# Patient Record
Sex: Female | Born: 1990 | Race: Black or African American | Hispanic: No | Marital: Married
Health system: Southern US, Community
[De-identification: ages and names within clinical notes are randomized; demographics above are authoritative.]

## PROBLEM LIST (undated history)

## (undated) DIAGNOSIS — D649 Anemia, unspecified: Secondary | ICD-10-CM

## (undated) DIAGNOSIS — Z9889 Other specified postprocedural states: Secondary | ICD-10-CM

## (undated) HISTORY — PX: NO PAST SURGERIES: SHX2092

## (undated) HISTORY — DX: Anemia, unspecified: D64.9

---

## 2017-11-01 ENCOUNTER — Ambulatory Visit (HOSPITAL_COMMUNITY)
Admission: EM | Admit: 2017-11-01 | Discharge: 2017-11-01 | Disposition: A | Discharge status: Home / Self Care | Payer: Medicaid Other | Attending: Internal Medicine | Admitting: Internal Medicine

## 2017-11-01 ENCOUNTER — Ambulatory Visit (INDEPENDENT_AMBULATORY_CARE_PROVIDER_SITE_OTHER): Payer: Medicaid Other

## 2017-11-01 ENCOUNTER — Encounter (HOSPITAL_COMMUNITY): Payer: Self-pay

## 2017-11-01 DIAGNOSIS — R079 Chest pain, unspecified: Secondary | ICD-10-CM | POA: Diagnosis not present

## 2017-11-01 DIAGNOSIS — R630 Anorexia: Secondary | ICD-10-CM

## 2017-11-01 DIAGNOSIS — R0789 Other chest pain: Secondary | ICD-10-CM | POA: Diagnosis not present

## 2017-11-01 DIAGNOSIS — Z131 Encounter for screening for diabetes mellitus: Secondary | ICD-10-CM | POA: Diagnosis not present

## 2017-11-01 LAB — GLUCOSE, CAPILLARY: Glucose-Capillary: 103 mg/dL — ABNORMAL HIGH (ref 70–99)

## 2017-11-01 MED ORDER — ACETAMINOPHEN 500 MG PO TABS: 500.0000 mg | ORAL_TABLET | Freq: Four times a day (QID) | ORAL | 0 refills | Status: DC | PRN

## 2017-11-01 MED ORDER — OMEPRAZOLE 20 MG PO CPDR: 20.0000 mg | DELAYED_RELEASE_CAPSULE | Freq: Every day | ORAL | 0 refills | Status: DC

## 2017-11-01 MED FILL — OMEPRAZOLE 20 MG CAP: 20 | 30 days supply | Qty: 30 | Fill #0

## 2017-11-01 NOTE — Discharge Instructions (Addendum)
Your chest xray, blood sugar and ekg look well today.  We will start with treatment with tylenol for pain, may take every 6 hours as needed. Please also take prilosec daily as reflux may be contributing to your symptoms.  Please follow up with a primary care provider for recheck as you may need further evaluation if symptoms persist.  If develop worsening of pain, shortness of breath , dizziness, vomiting or otherwise worsening please go to the Er.

## 2017-11-01 NOTE — ED Provider Notes (Signed)
[facility]    CSN: [id number] Arrival date & time: 11/01/17  0900     History   Chief Complaint Chief Complaint  Patient presents with  . Chest Pain  . Back Pain    HPI Robin Cordova is a 27 y.o. female.   Robin Cordova presents with complaints of chest pain which radiates to the back which started 3 days ago. Comes and goes. Currently its improved. Decreased appetite. Has been drinking a lot of fluids but unable to eat for the past two days. Normal urination. No nausea, vomiting or diarrhea. No fever. No shortness of breath or cough. No leg pain or swelling. She feels like the bottom of her feet have a burning sensation. No medical history and no history of any similar episodes. Travelled here from [place] three weeks ago. She does not smoke. No hormone therapy.    Swahili audio interpreter used to collect history and physical exam.    ROS per HPI.      History reviewed. No pertinent past medical history.  There are no active problems to display for this patient.   History reviewed. No pertinent surgical history.  OB History   None      Home Medications    Prior to Admission medications   Medication Sig Start Date End Date Taking? Authorizing Provider  acetaminophen (TYLENOL) 500 MG tablet Take 1 tablet (500 mg total) by mouth every 6 (six) hours as needed. 11/01/17   Georgetta Haber, NP  omeprazole (PRILOSEC) 20 MG capsule Take 1 capsule (20 mg total) by mouth daily. 11/01/17   Georgetta Haber, NP    Family History History reviewed. No pertinent family history.  Social History Social History   Tobacco Use  . Smoking status: Not on file  Substance Use Topics  . Alcohol use: Not on file  . Drug use: Not on file     Allergies   Patient has no allergy information on record.   Review of Systems Review of Systems   Physical Exam Triage Vital Signs ED Triage Vitals [11/01/17 0931]  Enc Vitals Group     BP 113/70     Pulse Rate 65     Resp  20     Temp 98.7 F (37.1 C)     Temp Source Oral     SpO2 100 %     Weight      Height      Head Circumference      Peak Flow      Pain Score      Pain Loc      Pain Edu?      Excl. in GC?    No data found.  Updated Vital Signs BP 113/70 (BP Location: Left Arm)   Pulse 65   Temp 98.7 F (37.1 C) (Oral)   Resp 20   LMP 10/19/2017   SpO2 100%    Physical Exam  Constitutional: She is oriented to person, place, and time. She appears well-developed and well-nourished. No distress.  HENT:  Head: Normocephalic and atraumatic.  Eyes: Pupils are equal, round, and reactive to light. EOM are normal.  Neck: Normal range of motion.  Cardiovascular: Normal rate and regular rhythm.  Murmur heard. Pulmonary/Chest: Effort normal and breath sounds normal.  Musculoskeletal:       Right lower leg: Normal.       Left lower leg: Normal.       Right foot: Normal.  Left foot: Normal.  Neurological: She is alert and oriented to person, place, and time.  Skin: Skin is warm and dry.   EKG NSR rate 85 without acute changes noted  UC Treatments / Results  Labs (all labs ordered are listed, but only abnormal results are displayed) Labs Reviewed  GLUCOSE, CAPILLARY - Abnormal; Notable for the following components:      Result Value   Glucose-Capillary 103 (*)    All other components within normal limits    EKG None  Radiology Dg Chest 2 View  Result Date: 11/01/2017 CLINICAL DATA:  27 year old female with mid and upper chest pain for 3 days. Polydipsia. EXAM: CHEST - 2 VIEW COMPARISON:  None. FINDINGS: Normal lung volumes and mediastinal contours. Visualized tracheal air column is within normal limits. Both lungs are clear. No pneumothorax or pleural effusion. Negative visible osseous structures and bowel gas pattern. IMPRESSION: Negative. Electronically Signed   By: Odessa FlemingH  Hall M.D.   On: 11/01/2017 10:23    Procedures Procedures (including critical care time)  Medications  Ordered in UC Medications - No data to display  Initial Impression / Assessment and Plan / UC Course  I have reviewed the triage vital signs and the nursing notes.  Pertinent labs & imaging results that were available during my care of the patient were reviewed by me and considered in my medical decision making (see chart for details).     Non toxic. Afebrile. Without tachycardia. Ekg, chest xray reassuring. Recent travel 3 weeks ago only risk factor for PE. No other cardiac risk factors, low suspicion for ACS at this time. Pain improved while in clinic today. Decreased appetite, question if gerd type symptoms are contributing, daily omeprazole initiated. Tylenol prn for pain. Encouraged establish with PCP for recheck as may need further evaluation if symptoms persist. Strict return precautions provided. Patient verbalized understanding and agreeable to plan.   Final Clinical Impressions(s) / UC Diagnoses   Final diagnoses:  Chest pain, unspecified type  Decreased appetite     Discharge Instructions     Your chest xray, blood sugar and ekg look well today.  We will start with treatment with tylenol for pain, may take every 6 hours as needed. Please also take prilosec daily as reflux may be contributing to your symptoms.  Please follow up with a primary care provider for recheck as you may need further evaluation if symptoms persist.  If develop worsening of pain, shortness of breath , dizziness, vomiting or otherwise worsening please go to the Er.     ED Prescriptions    Medication Sig Dispense Auth. Provider   acetaminophen (TYLENOL) 500 MG tablet Take 1 tablet (500 mg total) by mouth every 6 (six) hours as needed. 30 tablet Linus MakoBurky, Natalie B, NP   omeprazole (PRILOSEC) 20 MG capsule Take 1 capsule (20 mg total) by mouth daily. 30 capsule Georgetta HaberBurky, Natalie B, NP     Controlled Substance Prescriptions [place] Controlled Substance Registry consulted? Not Applicable   Georgetta HaberBurky, Natalie B,  NP 11/01/17 1042

## 2017-11-01 NOTE — ED Triage Notes (Signed)
Pt presents with chest and back pain and can only tell me chest is hurting , cant tell me of any other symptoms .

## 2018-01-31 ENCOUNTER — Ambulatory Visit (INDEPENDENT_AMBULATORY_CARE_PROVIDER_SITE_OTHER): Payer: Medicaid Other | Admitting: Family Medicine

## 2018-01-31 ENCOUNTER — Encounter: Payer: Self-pay | Admitting: Family Medicine

## 2018-01-31 ENCOUNTER — Ambulatory Visit: Payer: Medicaid Other | Admitting: Family Medicine

## 2018-01-31 VITALS — BP 114/69 | HR 52 | Resp 17 | Ht 62.0 in | Wt 125.6 lb

## 2018-01-31 DIAGNOSIS — Z130 Encounter for screening for diseases of the blood and blood-forming organs and certain disorders involving the immune mechanism: Secondary | ICD-10-CM | POA: Diagnosis not present

## 2018-01-31 DIAGNOSIS — Z1329 Encounter for screening for other suspected endocrine disorder: Secondary | ICD-10-CM

## 2018-01-31 DIAGNOSIS — Z131 Encounter for screening for diabetes mellitus: Secondary | ICD-10-CM

## 2018-01-31 DIAGNOSIS — Z23 Encounter for immunization: Secondary | ICD-10-CM

## 2018-01-31 DIAGNOSIS — Z0000 Encounter for general adult medical examination without abnormal findings: Secondary | ICD-10-CM

## 2018-01-31 DIAGNOSIS — Z7689 Persons encountering health services in other specified circumstances: Secondary | ICD-10-CM

## 2018-01-31 NOTE — Patient Instructions (Addendum)
Thank you for choosing Primary Care at [facility] to be your medical home!    Robin Cordova was seen by Molli Barrows, FNP today.   Robin Cordova's primary care provider is Scot Jun, FNP.   For the best care possible, you should try to see Molli Barrows, FNP-C whenever you come to the clinic.   We look forward to seeing you again soon!  If you have any questions about your visit today, please call us at [phone] or feel free to reach your primary care provider via [place].    Health Maintenance, Female Adopting a healthy lifestyle and getting preventive care can go a long way to promote health and wellness. Talk with your health care provider about what schedule of regular examinations is right for you. This is a good chance for you to check in with your provider about disease prevention and staying healthy. In between checkups, there are plenty of things you can do on your own. Experts have done a lot of research about which lifestyle changes and preventive measures are most likely to keep you healthy. Ask your health care provider for more information. Weight and diet Eat a healthy diet  Be sure to include plenty of vegetables, fruits, low-fat dairy products, and lean protein.  Do not eat a lot of foods high in solid fats, added sugars, or salt.  Get regular exercise. This is one of the most important things you can do for your health. ? Most adults should exercise for at least 150 minutes each week. The exercise should increase your heart rate and make you sweat (moderate-intensity exercise). ? Most adults should also do strengthening exercises at least twice a week. This is in addition to the moderate-intensity exercise.  Maintain a healthy weight  Body mass index (BMI) is a measurement that can be used to identify possible weight problems. It estimates body fat based on height and weight. Your health care provider can help determine your BMI and help you achieve or  maintain a healthy weight.  For females 62 years of age and older: ? A BMI below 18.5 is considered underweight. ? A BMI of 18.5 to 24.9 is normal. ? A BMI of 25 to 29.9 is considered overweight. ? A BMI of 30 and above is considered obese.  Watch levels of cholesterol and blood lipids  You should start having your blood tested for lipids and cholesterol at 26 years of age, then have this test every 5 years.  You may need to have your cholesterol levels checked more often if: ? Your lipid or cholesterol levels are high. ? You are older than 25 years of age. ? You are at high risk for heart disease.  Cancer screening Lung Cancer  Lung cancer screening is recommended for adults 38-74 years old who are at high risk for lung cancer because of a history of smoking.  A yearly low-dose CT scan of the lungs is recommended for people who: ? Currently smoke. ? Have quit within the past 15 years. ? Have at least a 30-pack-year history of smoking. A pack year is smoking an average of one pack of cigarettes a day for 1 year.  Yearly screening should continue until it has been 15 years since you quit.  Yearly screening should stop if you develop a health problem that would prevent you from having lung cancer treatment.  Breast Cancer  Practice breast self-awareness. This means understanding how your breasts normally appear and feel.  It  also means doing regular breast self-exams. Let your health care provider know about any changes, no matter how small.  If you are in your 20s or 30s, you should have a clinical breast exam (CBE) by a health care provider every 1-3 years as part of a regular health exam.  If you are 68 or older, have a CBE every year. Also consider having a breast X-ray (mammogram) every year.  If you have a family history of breast cancer, talk to your health care provider about genetic screening.  If you are at high risk for breast cancer, talk to your health care  provider about having an MRI and a mammogram every year.  Breast cancer gene (BRCA) assessment is recommended for women who have family members with BRCA-related cancers. BRCA-related cancers include: ? Breast. ? Ovarian. ? Tubal. ? Peritoneal cancers.  Results of the assessment will determine the need for genetic counseling and BRCA1 and BRCA2 testing.  Cervical Cancer Your health care provider may recommend that you be screened regularly for cancer of the pelvic organs (ovaries, uterus, and vagina). This screening involves a pelvic examination, including checking for microscopic changes to the surface of your cervix (Pap test). You may be encouraged to have this screening done every 3 years, beginning at age 36.  For women ages 67-65, health care providers may recommend pelvic exams and Pap testing every 3 years, or they may recommend the Pap and pelvic exam, combined with testing for human papilloma virus (HPV), every 5 years. Some types of HPV increase your risk of cervical cancer. Testing for HPV may also be done on women of any age with unclear Pap test results.  Other health care providers may not recommend any screening for nonpregnant women who are considered low risk for pelvic cancer and who do not have symptoms. Ask your health care provider if a screening pelvic exam is right for you.  If you have had past treatment for cervical cancer or a condition that could lead to cancer, you need Pap tests and screening for cancer for at least 20 years after your treatment. If Pap tests have been discontinued, your risk factors (such as having a new sexual partner) need to be reassessed to determine if screening should resume. Some women have medical problems that increase the chance of getting cervical cancer. In these cases, your health care provider may recommend more frequent screening and Pap tests.  Colorectal Cancer  This type of cancer can be detected and often prevented.  Routine  colorectal cancer screening usually begins at 26 years of age and continues through 26 years of age.  Your health care provider may recommend screening at an earlier age if you have risk factors for colon cancer.  Your health care provider may also recommend using home test kits to check for hidden blood in the stool.  A small camera at the end of a tube can be used to examine your colon directly (sigmoidoscopy or colonoscopy). This is done to check for the earliest forms of colorectal cancer.  Routine screening usually begins at age 74.  Direct examination of the colon should be repeated every 5-10 years through 26 years of age. However, you may need to be screened more often if early forms of precancerous polyps or small growths are found.  Skin Cancer  Check your skin from head to toe regularly.  Tell your health care provider about any new moles or changes in moles, especially if there is a change  in a mole's shape or color.  Also tell your health care provider if you have a mole that is larger than the size of a pencil eraser.  Always use sunscreen. Apply sunscreen liberally and repeatedly throughout the day.  Protect yourself by wearing long sleeves, pants, a wide-brimmed hat, and sunglasses whenever you are outside.  Heart disease, diabetes, and high blood pressure  High blood pressure causes heart disease and increases the risk of stroke. High blood pressure is more likely to develop in: ? People who have blood pressure in the high end of the normal range (130-139/85-89 mm Hg). ? People who are overweight or obese. ? People who are African American.  If you are 76-32 years of age, have your blood pressure checked every 3-5 years. If you are 59 years of age or older, have your blood pressure checked every year. You should have your blood pressure measured twice-once when you are at a hospital or clinic, and once when you are not at a hospital or clinic. Record the average of the  two measurements. To check your blood pressure when you are not at a hospital or clinic, you can use: ? An automated blood pressure machine at a pharmacy. ? A home blood pressure monitor.  If you are between 24 years and 62 years old, ask your health care provider if you should take aspirin to prevent strokes.  Have regular diabetes screenings. This involves taking a blood sample to check your fasting blood sugar level. ? If you are at a normal weight and have a low risk for diabetes, have this test once every three years after 26 years of age. ? If you are overweight and have a high risk for diabetes, consider being tested at a younger age or more often. Preventing infection Hepatitis B  If you have a higher risk for hepatitis B, you should be screened for this virus. You are considered at high risk for hepatitis B if: ? You were born in a country where hepatitis B is common. Ask your health care provider which countries are considered high risk. ? Your parents were born in a high-risk country, and you have not been immunized against hepatitis B (hepatitis B vaccine). ? You have HIV or AIDS. ? You use needles to inject street drugs. ? You live with someone who has hepatitis B. ? You have had sex with someone who has hepatitis B. ? You get hemodialysis treatment. ? You take certain medicines for conditions, including cancer, organ transplantation, and autoimmune conditions.  Hepatitis C  Blood testing is recommended for: ? Everyone born from [birth year] through 1964. ? Anyone with known risk factors for hepatitis C.  Sexually transmitted infections (STIs)  You should be screened for sexually transmitted infections (STIs) including gonorrhea and chlamydia if: ? You are sexually active and are younger than 26 years of age. ? You are older than 26 years of age and your health care provider tells you that you are at risk for this type of infection. ? Your sexual activity has changed since you  were last screened and you are at an increased risk for chlamydia or gonorrhea. Ask your health care provider if you are at risk.  If you do not have HIV, but are at risk, it may be recommended that you take a prescription medicine daily to prevent HIV infection. This is called pre-exposure prophylaxis (PrEP). You are considered at risk if: ? You are sexually active and do not regularly use  condoms or know the HIV status of your partner(s). ? You take drugs by injection. ? You are sexually active with a partner who has HIV.  Talk with your health care provider about whether you are at high risk of being infected with HIV. If you choose to begin PrEP, you should first be tested for HIV. You should then be tested every 3 months for as long as you are taking PrEP. Pregnancy  If you are premenopausal and you may become pregnant, ask your health care provider about preconception counseling.  If you may become pregnant, take 400 to 800 micrograms (mcg) of folic acid every day.  If you want to prevent pregnancy, talk to your health care provider about birth control (contraception). Osteoporosis and menopause  Osteoporosis is a disease in which the bones lose minerals and strength with aging. This can result in serious bone fractures. Your risk for osteoporosis can be identified using a bone density scan.  If you are 54 years of age or older, or if you are at risk for osteoporosis and fractures, ask your health care provider if you should be screened.  Ask your health care provider whether you should take a calcium or vitamin D supplement to lower your risk for osteoporosis.  Menopause may have certain physical symptoms and risks.  Hormone replacement therapy may reduce some of these symptoms and risks. Talk to your health care provider about whether hormone replacement therapy is right for you. Follow these instructions at home:  Schedule regular health, dental, and eye exams.  Stay current  with your immunizations.  Do not use any tobacco products including cigarettes, chewing tobacco, or electronic cigarettes.  If you are pregnant, do not drink alcohol.  If you are breastfeeding, limit how much and how often you drink alcohol.  Limit alcohol intake to no more than 1 drink per day for nonpregnant women. One drink equals 12 ounces of beer, 5 ounces of wine, or 1 ounces of hard liquor.  Do not use street drugs.  Do not share needles.  Ask your health care provider for help if you need support or information about quitting drugs.  Tell your health care provider if you often feel depressed.  Tell your health care provider if you have ever been abused or do not feel safe at home. This information is not intended to replace advice given to you by your health care provider. Make sure you discuss any questions you have with your health care provider. Document Released: 10/02/2010 Document Revised: 08/25/2015 Document Reviewed: 12/21/2014 Elsevier Interactive Patient Education  [employer].

## 2018-01-31 NOTE — Progress Notes (Signed)
Patient ID: Robin Cordova, female   DOB: 1990/05/17, 27 y.o.   MRN: [id number]    PCP: Bing Neighbors, FNP  Chief Complaint  Patient presents with  . Establish Care    Subjective:  HPI Robin Cordova is a 27 y.o. female presents for evaluation routine annual exam. Medical history is only significant for routine vaginal deliveries x 2 weeks.  Reports a routine [facility].  Social History   Socioeconomic History  . Marital status: Married    Spouse name: Not on file  . Number of children: Not on file  . Years of education: Not on file  . Highest education level: Not on file  Occupational History  . Not on file  Social Needs  . Financial resource strain: Not on file  . Food insecurity:    Worry: Not on file    Inability: Not on file  . Transportation needs:    Medical: Not on file    Non-medical: Not on file  Tobacco Use  . Smoking status: Never Smoker  . Smokeless tobacco: Never Used  Substance and Sexual Activity  . Alcohol use: Never    Frequency: Never  . Drug use: Never  . Sexual activity: Not on file  Lifestyle  . Physical activity:    Days per week: Not on file    Minutes per session: Not on file  . Stress: Not on file  Relationships  . Social connections:    Talks on phone: Not on file    Gets together: Not on file    Attends religious service: Not on file    Active member of club or organization: Not on file    Attends meetings of clubs or organizations: Not on file    Relationship status: Not on file  . Intimate partner violence:    Fear of current or ex partner: Not on file    Emotionally abused: Not on file    Physically abused: Not on file    Forced sexual activity: Not on file  Other Topics Concern  . Not on file  Social History Narrative  . Not on file    Family History  Problem Relation Age of Onset  . Healthy Mother   . Healthy Father   . Healthy Daughter   . Healthy Son   . Cancer Neg Hx   .  Stroke Neg Hx     Review of Systems  All other systems reviewed and are negative.  No Known Allergies  Prior to Admission medications   Not on File    Past Medical, Surgical Family and Social History reviewed and updated.    Objective:   Today's Vitals   01/31/18 1102  BP: 114/69  Pulse: (!) 52  Resp: 17  SpO2: 99%  Weight: 125 lb 9.6 oz (57 kg)  Height: 5\' 2"  (1.575 m)    Wt Readings from Last 3 Encounters:  01/31/18 125 lb 9.6 oz (57 kg)   Physical Exam BP 114/69   Pulse (!) 52   Resp 17   Ht 5\' 2"  (1.575 m)   Wt 125 lb 9.6 oz (57 kg)   SpO2 99%   BMI 22.97 kg/m   General Appearance:    Alert, cooperative, no distress, appears stated age  Head:    Normocephalic, without obvious abnormality, atraumatic  Eyes:    PERRL, conjunctiva/corneas clear, EOM's intact, fundi    benign, both eyes  Ears:  Normal TM's and external ear canals, both ears  Nose:   Nares normal, septum midline, mucosa normal, no drainage    or sinus tenderness  Throat:   Lips, mucosa, and tongue normal; teeth and gums normal  Neck:   Supple, symmetrical, trachea midline, no adenopathy;    thyroid:  no enlargement/tenderness/nodules; no carotid   bruit or JVD  Back:     Symmetric, no curvature, ROM normal, no CVA tenderness  Lungs:     Clear to auscultation bilaterally, respirations unlabored  Chest Wall:    No tenderness or deformity   Heart:    Regular rate and rhythm, S1 and S2 normal, no murmur, rub   or gallop. Radial pulses +2, equal bilaterally.  Breast Exam:    No tenderness, masses, or nipple abnormality  Abdomen:     Soft, non-tender, bowel sounds active all four quadrants,    no masses, no organomegaly  Extremities:   Extremities normal, atraumatic, no cyanosis or edema  Skin:   Skin color, texture, turgor normal, no rashes or lesions  Neurologic:   Normal strength, sensation and reflexes    throughout     Assessment & Plan:  1. Encounter to establish care 2. Blood tests  for routine general physical examination 3. Routine general medical examination at a health care facility - Flu Vaccine QUAD 36+ mos IM 4. Screening for deficiency anemia - CBC with Differential; Future 5. Screening for thyroid disorder - Thyroid Panel With TSH 6. Screening for diabetes mellitus - Comprehensive metabolic panel; Future - Hemoglobin A1c - Comprehensive metabolic panel   Orders Placed This Encounter  Procedures  . Flu Vaccine QUAD 36+ mos IM  . CBC with Differential  . Comprehensive metabolic panel  . Thyroid Panel With TSH  . Hemoglobin A1c    If symptoms worsen or do not improve, return for follow-up, follow-up with PCP, or at the emergency department if severity of symptoms warrant a higher level of care.     Joaquin Courts, FNP [facility] at [facility] [address], [place]: [address]  [phone]

## 2018-02-01 LAB — HEMOGLOBIN A1C
Est. average glucose Bld gHb Est-mCnc: 105 mg/dL
HEMOGLOBIN A1C: 5.3 % (ref 4.8–5.6)

## 2018-02-02 LAB — CBC WITH DIFFERENTIAL/PLATELET
BASOS ABS: 0 10*3/uL (ref 0.0–0.2)
BASOS: 1 %
EOS (ABSOLUTE): 0.2 10*3/uL (ref 0.0–0.4)
Eos: 4 %
HEMOGLOBIN: 13.3 g/dL (ref 11.1–15.9)
Hematocrit: 39.7 % (ref 34.0–46.6)
IMMATURE GRANS (ABS): 0 10*3/uL (ref 0.0–0.1)
IMMATURE GRANULOCYTES: 0 %
Lymphocytes Absolute: 2.1 10*3/uL (ref 0.7–3.1)
Lymphs: 42 %
MCH: 29.2 pg (ref 26.6–33.0)
MCHC: 33.5 g/dL (ref 31.5–35.7)
MCV: 87 fL (ref 79–97)
MONOCYTES: 11 %
Monocytes Absolute: 0.6 10*3/uL (ref 0.1–0.9)
NEUTROS PCT: 42 %
Neutrophils Absolute: 2.1 10*3/uL (ref 1.4–7.0)
Platelets: 329 10*3/uL (ref 150–450)
RBC: 4.56 x10E6/uL (ref 3.77–5.28)
RDW: 13.6 % (ref 12.3–15.4)
WBC: 5 10*3/uL (ref 3.4–10.8)

## 2018-02-02 LAB — COMPREHENSIVE METABOLIC PANEL
ALT: 13 IU/L (ref 0–32)
AST: 22 IU/L (ref 0–40)
Albumin/Globulin Ratio: 1.4 (ref 1.2–2.2)
Albumin: 4.3 g/dL (ref 3.5–5.5)
Alkaline Phosphatase: 68 IU/L (ref 39–117)
BUN/Creatinine Ratio: 6 — ABNORMAL LOW (ref 9–23)
BUN: 4 mg/dL — AB (ref 6–20)
Bilirubin Total: 0.5 mg/dL (ref 0.0–1.2)
CALCIUM: 9.2 mg/dL (ref 8.7–10.2)
CO2: 17 mmol/L — AB (ref 20–29)
Chloride: 106 mmol/L (ref 96–106)
Creatinine, Ser: 0.65 mg/dL (ref 0.57–1.00)
GFR, EST AFRICAN AMERICAN: 142 mL/min/{1.73_m2} (ref >59)
GFR, EST NON AFRICAN AMERICAN: 123 mL/min/{1.73_m2} (ref >59)
GLUCOSE: 84 mg/dL (ref 65–99)
Globulin, Total: 3.1 g/dL (ref 1.5–4.5)
Potassium: 4.6 mmol/L (ref 3.5–5.2)
Sodium: 142 mmol/L (ref 134–144)
TOTAL PROTEIN: 7.4 g/dL (ref 6.0–8.5)

## 2018-02-02 LAB — THYROID PANEL WITH TSH
Free Thyroxine Index: 1.8 (ref 1.2–4.9)
T3 Uptake Ratio: 23 % — ABNORMAL LOW (ref 24–39)
T4, Total: 7.9 ug/dL (ref 4.5–12.0)
TSH: 1.41 u[IU]/mL (ref 0.450–4.500)

## 2018-02-14 NOTE — Progress Notes (Signed)
Normal lab letter mailed.

## 2019-03-30 ENCOUNTER — Ambulatory Visit: Payer: Medicaid Other | Attending: Internal Medicine

## 2019-03-30 DIAGNOSIS — Z20822 Contact with and (suspected) exposure to covid-19: Secondary | ICD-10-CM

## 2019-04-01 LAB — NOVEL CORONAVIRUS, NAA: SARS-CoV-2, NAA: NOT DETECTED

## 2019-04-03 NOTE — L&D Delivery Note (Addendum)
OB/GYN Faculty Practice Delivery Note  Robin Cordova is a 29 y.o. [id number] s/p SVD at [redacted]w[redacted]d. She was admitted for labor management.   ROM: 0h 11m with clear fluid GBS Status: Negative/-- (09/03 1410) Maximum Maternal Temperature: 98.7 F  Labor Progress: Patient presented to L&D for labor management. Initial SVE: 10/100/+2. Labor course was uncomplicated. AROM @ 1735 with clear fluid.   Delivery Date/Time: 12/23/19 @ 1753 Delivery: Called to room and patient was complete. Infant head delivered with maternal pushing, LOA position. Shoulder dystocia identified and was fast resolved with McRoberts, suprapubic pressure, and delivery of posterior arm. No nuchal cord present. Infant with spontaneous cry, placed on mother's abdomen, dried and stimulated. Cord clamped x 2 after 1-minute delay, and cut by Dr. Dorice Lamas. Cord blood drawn. Placenta delivered spontaneously with gentle cord traction. Trailing placental membranes removed from vault. Fundus firm with massage and pitocin started. Labia, perineum, vagina, and cervix inspected and significant for 2nd degree perineal lac.   Baby Weight: per chart  Cord: central insertion, 3 vessel Placenta: Sent to L&D, intact Complications: SD Lacerations: 2nd degree perineal, and was repaired in the standard fashion with 2-0 Vicryl rapide EBL: 400 cc Analgesia: local, Fentanyl   Infant: APGAR (1 MIN): 9   APGAR (5 MINS): 9   Herby Abraham MD, PGY-1 OBGYN Faculty Teaching Service  12/23/2019, 9:30 PM   Midwife attestation: I was gloved and present for delivery in its entirety and I agree with the above resident's note.  Donette Larry, CNM 8:32 AM

## 2019-08-03 ENCOUNTER — Other Ambulatory Visit (HOSPITAL_COMMUNITY)
Admission: RE | Admit: 2019-08-03 | Discharge: 2019-08-03 | Disposition: A | Discharge status: Home / Self Care | Payer: Medicaid Other | Source: Ambulatory Visit | Attending: Family Medicine | Admitting: Family Medicine

## 2019-08-03 ENCOUNTER — Other Ambulatory Visit: Payer: Self-pay

## 2019-08-03 ENCOUNTER — Ambulatory Visit (INDEPENDENT_AMBULATORY_CARE_PROVIDER_SITE_OTHER): Payer: Medicaid Other | Admitting: Family Medicine

## 2019-08-03 VITALS — BP 102/60 | HR 83 | Temp 98.9°F | Wt 136.2 lb

## 2019-08-03 DIAGNOSIS — Z3492 Encounter for supervision of normal pregnancy, unspecified, second trimester: Secondary | ICD-10-CM | POA: Diagnosis not present

## 2019-08-03 DIAGNOSIS — Z124 Encounter for screening for malignant neoplasm of cervix: Secondary | ICD-10-CM | POA: Insufficient documentation

## 2019-08-03 DIAGNOSIS — Z113 Encounter for screening for infections with a predominantly sexual mode of transmission: Secondary | ICD-10-CM

## 2019-08-03 DIAGNOSIS — Z3490 Encounter for supervision of normal pregnancy, unspecified, unspecified trimester: Secondary | ICD-10-CM | POA: Insufficient documentation

## 2019-08-03 LAB — POCT URINALYSIS DIP (MANUAL ENTRY)
Bilirubin, UA: NEGATIVE
Blood, UA: NEGATIVE
Glucose, UA: NEGATIVE mg/dL
Ketones, POC UA: NEGATIVE mg/dL
Leukocytes, UA: NEGATIVE
Nitrite, UA: NEGATIVE
Protein Ur, POC: NEGATIVE mg/dL
Spec Grav, UA: 1.02 (ref 1.010–1.025)
Urobilinogen, UA: 1 E.U./dL
pH, UA: 7.5 (ref 5.0–8.0)

## 2019-08-03 MED ORDER — PRENATAL MULTIVITAMIN CH: 1.0000 | ORAL_TABLET | Freq: Every day | ORAL | 1 refills | Status: DC

## 2019-08-03 NOTE — Progress Notes (Signed)
Patient Name: Robin Cordova Date of Birth: [birth year] [facility] Initial Prenatal Visit  Robin Cordova is a 29 y.o. year old No obstetric history on file. at Unknown who presents for her initial prenatal visit. Pregnancy is planned She reports nausea. She is not taking a prenatal vitamin.  She denies pelvic pain or vaginal bleeding.   Chart review shows that she did have 1 incidence of vaginal bleeding about 1 month ago and was assessed in the emergency room and [place].  She has had no further incidents of vaginal bleeding since that time.  Pregnancy Dating: . The patient is dated by unsure.  Marland Kitchen LMP: 03/16/2020 not confident that her LMP is correct . Period is certain:  No.  . Periods were regular:  No.  . LMP was a typical period:  No.  . Using hormonal contraception in 3 months prior to conception: No  Lab Review: . No blood work or [name] yet done for this pregnancy. No lab work on file.  PMH: Reviewed and as detailed below: . HTN: No  . Type 1 or 2 Diabetes: No  . Depression:  No  . Seizure disorder:  No . VTE: No ,  . History of STI No,  . Abnormal Pap smear:  No, . Genital herpes simplex:  unsure  PSH: . Gynecologic Surgery:  no . Surgical history reviewed, notable for: nothing  Obstetric History: . Obstetric history tab updated and reviewed.  . Summary of prior pregnancies: 2 previous healthy term vaginal deliveries complicated by an episiotomy during the first delivery. . Cesarean delivery: No  . Gestational Diabetes:  No . Hypertension in pregnancy: No . History of preterm birth: No . History of LGA/SGA infant:  No . History of shoulder dystocia: No . Indications for referral were reviewed, and the patient has no obstetric indications for referral to High Risk OB Clinic at this time.   Social History: . Partner's name: She would prefer not to have his name documented. He is in [place] and may not really be involved. . Tobacco use: No . Alcohol  use:  No . Other substance use:  No  Current Medications:  . none  . Reviewed and appropriate in pregnancy.   Genetic and Infection Screen: . Flow Sheet Updated Yes  Prenatal Exam: Gen: Well nourished, well developed.  No distress.  Vitals noted. HEENT: Normocephalic, atraumatic.  Neck supple without cervical lymphadenopathy, thyromegaly or thyroid nodules.  Fair dentition. CV: RRR no murmur, gallops or rubs Lungs: CTA B.  Normal respiratory effort without wheezes or rales. Abd: soft, NTND. +BS.  Uterus not appreciated above pelvis. GU: Normal external female genitalia without lesions.  Nl vaginal, well rugated without lesions. No vaginal discharge.  Bimanual exam: No adnexal mass or TTP. No CMT.   Ext: No clubbing, cyanosis or edema. Psych: Normal grooming and dress.  Not depressed or anxious appearing.  Normal thought content and process without flight of ideas or looseness of associations  Fetal heart tones: Appropriate Fundal height 16cm  Assessment/Plan:  Robin Cordova is a 29 y.o. No obstetric history on file. at Unknown who presents to initiate prenatal care. She is doing well.  Current pregnancy issues include unknown dating.  1. Routine prenatal care: Marland Kitchen As dating is not reliable, a dating ultrasound has been ordered. Dating tab updated. . Pre-pregnancy weight updated. Expected weight gain this pregnancy is 25-35 pounds  . Prenatal labs drawn today.  Anatomy and dating ultrasound placed today. . Indications for referral  to HROB were reviewed and the patient does not meet criteria for referral.  . Medication list reviewed and updated.   . Recommended patient see a dentist for regular care.  . Bleeding and pain precautions reviewed. . Importance of prenatal vitamins reviewed. She was encouraged to start taking her prenatal vitamins daily. . Genetic screening offered. Patient opted for: quad screen at 16-20 weeks.  This will likely have to wait until after dating  ultrasound. . The patient will not be age 52 or over at time of delivery. Referral to genetic counseling was offered today based on family history of possible Hirschsprung.  . The patient has the following risk factors for preexisting diabetes: Reviewed indications for early 1 hour glucose testing, not indicated . An early 1 hour glucose tolerance test was not ordered. . Pregnancy Medical Home and PHQ-9 forms completed, problems noted: No  . Unclear history of HSV: Follow-up serum testing.    Follow up 4 weeks for next prenatal visit.

## 2019-08-03 NOTE — Patient Instructions (Addendum)
1)  Start taking your prenatal vitamins every day  2)  Return to clinic on 09/01/2019 at 130 for your next OB appointment.  That is June 1 at 1:30 in the afternoon.  3) remember to go to your appointment for your ultrasound.  You have an appointment on May 19 at 8:30 in the morning.  The address you need to go to is:   [facility] [address], [place], [place] [address]  4) I have put in a referral for you to go to genetic counseling.  For this to be helpful, we need to get some additional testing.  This will need to wait until after the ultrasound.  5) we did a lot of labs today.  I will let you know if there are any concerning results.      1) Anza kuchukua vitamini vyako vya ujauzito kila siku  2) Rudi kliniki mnamo 09/01/2019 saa 130 kwa miadi yako ijayo ya OB. Hiyo ni Juni 1 saa 1:30 alasiri.  3) Ericka Pontiff miadi yako kwa ultrasound yako. Una miadi mnamo Mei 19 saa 8:30 asubuhi. Anwani unayohitaji Ellin Mayhew ni:  [facility] [address], [place], [place] [address]  4) Nimeweka rufaa kwako kwenda kwa ushauri wa maumbile. Ili hii iweze kusaidia, tunahitaji kupata upimaji wa ziada. Hii itahitaji kusubiri hadi baada ya ultrasound.  5) tumefanya maabara mengi leo. Nitakujulisha ikiwa kuna matokeo [employer].

## 2019-08-04 LAB — CYTOLOGY - PAP

## 2019-08-05 ENCOUNTER — Telehealth: Payer: Self-pay | Admitting: Family Medicine

## 2019-08-05 LAB — OBSTETRIC PANEL, INCLUDING HIV
Basophils Absolute: 0 10*3/uL (ref 0.0–0.2)
Basos: 0 %
EOS (ABSOLUTE): 0.1 10*3/uL (ref 0.0–0.4)
Eos: 1 %
HIV Screen 4th Generation wRfx: NONREACTIVE
Hematocrit: 32.7 % — ABNORMAL LOW (ref 34.0–46.6)
Hemoglobin: 11.1 g/dL (ref 11.1–15.9)
Hepatitis B Surface Ag: NEGATIVE
Immature Grans (Abs): 0 10*3/uL (ref 0.0–0.1)
Immature Granulocytes: 0 %
Lymphocytes Absolute: 1.4 10*3/uL (ref 0.7–3.1)
Lymphs: 29 %
MCH: 30.5 pg (ref 26.6–33.0)
MCHC: 33.9 g/dL (ref 31.5–35.7)
MCV: 90 fL (ref 79–97)
Monocytes Absolute: 0.5 10*3/uL (ref 0.1–0.9)
Monocytes: 11 %
Neutrophils Absolute: 2.9 10*3/uL (ref 1.4–7.0)
Neutrophils: 59 %
Platelets: 295 10*3/uL (ref 150–450)
RBC: 3.64 x10E6/uL — ABNORMAL LOW (ref 3.77–5.28)
RDW: 13.2 % (ref 11.7–15.4)
RPR Ser Ql: NONREACTIVE
Rh Factor: POSITIVE
Rubella Antibodies, IGG: >33 index (ref >0.99)
WBC: 5 10*3/uL (ref 3.4–10.8)

## 2019-08-05 LAB — HGB FRACTIONATION CASCADE
Hgb A2: 2.8 % (ref 1.8–3.2)
Hgb A: 97.2 % (ref 96.4–98.8)
Hgb F: 0 % (ref 0.0–2.0)
Hgb S: 0 %

## 2019-08-05 LAB — AB SCR+ANTIBODY ID: Antibody Screen: POSITIVE — AB

## 2019-08-05 LAB — CULTURE, OB URINE

## 2019-08-05 LAB — HSV 2 ANTIBODY, IGG: HSV 2 IgG, Type Spec: 7.1 index — ABNORMAL HIGH (ref 0.00–0.90)

## 2019-08-05 LAB — URINE CULTURE, OB REFLEX: Organism ID, Bacteria: NO GROWTH

## 2019-08-05 MED ORDER — CLOTRIMAZOLE 1 % VA CREA: 1.0000 | TOPICAL_CREAM | Freq: Every day | VAGINAL | 0 refills | Status: AC

## 2019-08-05 NOTE — Telephone Encounter (Signed)
Using [employer] interpreters, Robin Cordova was called and informed of the following results:  HSV positive: She was informed that she is HSV positive which means that she will have this virus in her body for the rest of her life.  She is not currently having an outbreak.  She was informed that if she does have an outbreak during the pregnancy we need to treat it.  She is also told that she will be started on acyclovir at 36 weeks to avoid outbreaks during the delivery.  She was told that an outbreak at the time of the delivery would necessitate a C-section.  Vaginal candidiasis: She was informed that she has a yeast infection of her vagina.  Vaginal clotrimazole was sent to her pharmacy.  Change in ultrasound appointment: She is informed that her new ultrasound date is 5/11 at 8:15 in the morning.   We will discuss the following issues at her next appointment:   Antibody testing: She will need repeat antibody testing at her next appointment on 6/1 for further assessment of her positive antibody screen.  Cervical cancer screening: Pap smear showed LISL.  ASCCP guidelines recommend colposcopy.  We will plan for colposcopy following delivery.  Mirian Mo, MD

## 2019-08-11 ENCOUNTER — Ambulatory Visit: Payer: Medicaid Other | Attending: Family Medicine

## 2019-08-11 ENCOUNTER — Other Ambulatory Visit: Payer: Self-pay | Admitting: *Deleted

## 2019-08-11 ENCOUNTER — Other Ambulatory Visit: Payer: Self-pay

## 2019-08-11 DIAGNOSIS — Z362 Encounter for other antenatal screening follow-up: Secondary | ICD-10-CM

## 2019-08-11 DIAGNOSIS — Z3492 Encounter for supervision of normal pregnancy, unspecified, second trimester: Secondary | ICD-10-CM

## 2019-08-11 DIAGNOSIS — Z363 Encounter for antenatal screening for malformations: Secondary | ICD-10-CM

## 2019-08-11 DIAGNOSIS — Z3A21 21 weeks gestation of pregnancy: Secondary | ICD-10-CM | POA: Diagnosis not present

## 2019-08-19 ENCOUNTER — Encounter: Payer: Self-pay | Admitting: Family Medicine

## 2019-08-19 ENCOUNTER — Other Ambulatory Visit: Payer: Medicaid Other

## 2019-08-19 DIAGNOSIS — R87612 Low grade squamous intraepithelial lesion on cytologic smear of cervix (LGSIL): Secondary | ICD-10-CM | POA: Insufficient documentation

## 2019-08-19 DIAGNOSIS — O289 Unspecified abnormal findings on antenatal screening of mother: Secondary | ICD-10-CM | POA: Insufficient documentation

## 2019-08-19 DIAGNOSIS — B009 Herpesviral infection, unspecified: Secondary | ICD-10-CM | POA: Insufficient documentation

## 2019-08-20 ENCOUNTER — Other Ambulatory Visit: Payer: Self-pay

## 2019-08-20 ENCOUNTER — Ambulatory Visit (INDEPENDENT_AMBULATORY_CARE_PROVIDER_SITE_OTHER): Payer: Medicaid Other | Admitting: Family Medicine

## 2019-08-20 VITALS — BP 92/62 | HR 66 | Wt 140.2 lb

## 2019-08-20 DIAGNOSIS — Z3492 Encounter for supervision of normal pregnancy, unspecified, second trimester: Secondary | ICD-10-CM | POA: Diagnosis not present

## 2019-08-20 DIAGNOSIS — B009 Herpesviral infection, unspecified: Secondary | ICD-10-CM

## 2019-08-20 DIAGNOSIS — O219 Vomiting of pregnancy, unspecified: Secondary | ICD-10-CM

## 2019-08-20 DIAGNOSIS — O289 Unspecified abnormal findings on antenatal screening of mother: Secondary | ICD-10-CM

## 2019-08-20 MED ORDER — DOXYLAMINE-PYRIDOXINE 10-10 MG PO TBEC: DELAYED_RELEASE_TABLET | ORAL | 3 refills | Status: DC

## 2019-08-20 NOTE — Assessment & Plan Note (Signed)
Will need valacyclovir at 36 weeks and beyond. Discussed.

## 2019-08-20 NOTE — Progress Notes (Addendum)
  [facility] Prenatal Visit   The patient speaks Swahili as their primary language.  An interpreter was used for the entire visit.   Robin Cordova is a 29 y.o. G3P2002 at [redacted]w[redacted]d here for routine follow up. She is dated by LMP.  She reports mild nausea. She is eating and drinking well. She completed course of clotrimazole. No further discharge.   She reports good fetal movement. No bleeding, loss of fluid, contractions. See flow sheet for details.  Vitals:   08/20/19 1002  BP: 92/62  Pulse: 66    A/P: Pregnancy at [redacted]w[redacted]d.  Doing well.    . Dating reviewed, dating tab is correct . Fetal heart tones Appropriate . Fundal height >2 cm from expected size given dating, discussed with preceptor.  Remeasured appropriate.  . Pregnancy issues include o  +HSV antibodies.Rx for valacyclovir at 36 weeks. Discussed precautions with outbreak.  o History of LSIL-- Will need scheduling with colpo clinic following delivery. . Family support- Mother and siblings are helping . Lead and Hep C obtained today per CDC refugee health guidelines   Anatomy ultrasound reviewed and with normal anatomy and good fetal movement.  Suboptimal views of the fetal anatomy was obtained secondary to fetal position. Follow up growth and anatomy US scheduled in 4 weeks.   . Problem list updated Yes.  . Influenza vaccine not administered as not influenza season. .  . Indications for screening for preexisting diabetes include: Reviewed indications for early 1 hour glucose testing, not indicated .  Marland Kitchen Pregnancy education provided on the following topics: fetal growth and movement, ultrasound assessment, and upcoming laboratory assessment.  . Preterm labor precautions given. . Diclegis for nausea   #Swahili interpreter used during entire encounter Sullivan Lone in-person and Imran [phone])   Follow up 4 weeks.  Patient seen with Dr. Salvadore Dom. I discussed the plan of care with the resident physician and agree with  below documentation. Will need to discuss history of episiotomy.  Terisa Starr, MD

## 2019-08-20 NOTE — Patient Instructions (Addendum)
Medication for Nausea   Initial: Two tablets at bedtime on day 1 and 2; if symptoms persist, take 1 tablet in morning and 2 tablets at bedtime on day 3; if symptoms persist, may increase to 1 tablet in morning, 1 tablet mid-afternoon, and 2 tablets at bedtime on day   It was wonderful to see you today.  Please bring ALL of your medications with you to every visit.   Thank you for choosing [facility].   Please call [phone] with any questions about today's appointment.  Please be sure to schedule follow up at the front  desk before you leave today.   Lavonda Jumbo, DO  Family Medicine

## 2019-08-20 NOTE — Assessment & Plan Note (Signed)
Send antibody screen to [facility] lab at follow up--please discuss with preceptor and Tessie Fass prior to sending. Do not recommend resending to [facility].

## 2019-08-20 NOTE — Assessment & Plan Note (Signed)
Rx for Diclegis.  Discussed dosing.

## 2019-08-21 LAB — HCV INTERPRETATION

## 2019-08-21 LAB — LEAD, BLOOD (ADULT >= 16 YRS): Lead-Whole Blood: 3 ug/dL (ref 0–4)

## 2019-08-21 LAB — HCV AB W REFLEX TO QUANT PCR: HCV Ab: <0.1 s/co ratio (ref 0.0–0.9)

## 2019-08-26 ENCOUNTER — Telehealth: Payer: Self-pay | Admitting: Family Medicine

## 2019-08-26 ENCOUNTER — Encounter: Payer: Self-pay | Admitting: Family Medicine

## 2019-08-26 DIAGNOSIS — Z9889 Other specified postprocedural states: Secondary | ICD-10-CM | POA: Insufficient documentation

## 2019-08-26 MED ORDER — FAMOTIDINE 40 MG PO TABS: 20.0000 mg | ORAL_TABLET | Freq: Two times a day (BID) | ORAL | 0 refills | Status: DC

## 2019-08-26 NOTE — Telephone Encounter (Signed)
Erroneous

## 2019-08-26 NOTE — Telephone Encounter (Signed)
The patient speaks Swahili as their primary language.  An interpreter was used for the entire visit.   Called with normal results.   Clarified history of episiotomy, she reports performed as 'prophylaxis' as part of routine at hospital where she delivered. No prior history of difficulty with labor or shoulder dystocia.   Reports intermittent burning after meals, eats one large meal per day at 3 PM. Recommended trial of H2 blocker.   All questions answered. Needs repeat antibody screen at follow up.   Terisa Starr, MD  Family Medicine Teaching Service

## 2019-08-28 ENCOUNTER — Emergency Department (HOSPITAL_COMMUNITY): Payer: Medicaid Other

## 2019-08-28 ENCOUNTER — Emergency Department (HOSPITAL_COMMUNITY)
Admission: EM | Admit: 2019-08-28 | Discharge: 2019-08-29 | Disposition: A | Discharge status: Home / Self Care | Payer: Medicaid Other | Attending: Emergency Medicine | Admitting: Emergency Medicine

## 2019-08-28 ENCOUNTER — Encounter (HOSPITAL_COMMUNITY): Payer: Self-pay

## 2019-08-28 DIAGNOSIS — O99891 Other specified diseases and conditions complicating pregnancy: Secondary | ICD-10-CM | POA: Insufficient documentation

## 2019-08-28 DIAGNOSIS — Z3A23 23 weeks gestation of pregnancy: Secondary | ICD-10-CM | POA: Diagnosis not present

## 2019-08-28 DIAGNOSIS — R0981 Nasal congestion: Secondary | ICD-10-CM

## 2019-08-28 DIAGNOSIS — Z79899 Other long term (current) drug therapy: Secondary | ICD-10-CM | POA: Diagnosis not present

## 2019-08-28 DIAGNOSIS — Z20822 Contact with and (suspected) exposure to covid-19: Secondary | ICD-10-CM | POA: Insufficient documentation

## 2019-08-28 DIAGNOSIS — G4489 Other headache syndrome: Secondary | ICD-10-CM | POA: Diagnosis not present

## 2019-08-28 DIAGNOSIS — R079 Chest pain, unspecified: Secondary | ICD-10-CM | POA: Diagnosis not present

## 2019-08-28 DIAGNOSIS — O99352 Diseases of the nervous system complicating pregnancy, second trimester: Secondary | ICD-10-CM | POA: Diagnosis not present

## 2019-08-28 LAB — CBC
HCT: 32.5 % — ABNORMAL LOW (ref 36.0–46.0)
Hemoglobin: 10.8 g/dL — ABNORMAL LOW (ref 12.0–15.0)
MCH: 29.7 pg (ref 26.0–34.0)
MCHC: 33.2 g/dL (ref 30.0–36.0)
MCV: 89.3 fL (ref 80.0–100.0)
Platelets: 282 10*3/uL (ref 150–400)
RBC: 3.64 MIL/uL — ABNORMAL LOW (ref 3.87–5.11)
RDW: 12.9 % (ref 11.5–15.5)
WBC: 7.2 10*3/uL (ref 4.0–10.5)
nRBC: 0 % (ref 0.0–0.2)

## 2019-08-28 MED ORDER — SODIUM CHLORIDE 0.9% FLUSH: 3.0000 mL | Freq: Once | INTRAVENOUS | Status: DC

## 2019-08-28 NOTE — ED Triage Notes (Signed)
Pt states that she has been having a headache and CP for the past two days, some nausea, denies other cardiac symptoms. Pt is also 5 months pregnant.

## 2019-08-29 ENCOUNTER — Other Ambulatory Visit: Payer: Self-pay

## 2019-08-29 LAB — HEPATIC FUNCTION PANEL
ALT: 12 U/L (ref 0–44)
AST: 21 U/L (ref 15–41)
Albumin: 2.9 g/dL — ABNORMAL LOW (ref 3.5–5.0)
Alkaline Phosphatase: 68 U/L (ref 38–126)
Bilirubin, Direct: <0.1 mg/dL (ref 0.0–0.2)
Total Bilirubin: <0.1 mg/dL — ABNORMAL LOW (ref 0.3–1.2)
Total Protein: 6.1 g/dL — ABNORMAL LOW (ref 6.5–8.1)

## 2019-08-29 LAB — BASIC METABOLIC PANEL
Anion gap: 8 (ref 5–15)
BUN: <5 mg/dL — ABNORMAL LOW (ref 6–20)
CO2: 21 mmol/L — ABNORMAL LOW (ref 22–32)
Calcium: 8.4 mg/dL — ABNORMAL LOW (ref 8.9–10.3)
Chloride: 106 mmol/L (ref 98–111)
Creatinine, Ser: 0.42 mg/dL — ABNORMAL LOW (ref 0.44–1.00)
GFR calc Af Amer: >60 mL/min (ref >60)
GFR calc non Af Amer: >60 mL/min (ref >60)
Glucose, Bld: 78 mg/dL (ref 70–99)
Potassium: 3.6 mmol/L (ref 3.5–5.1)
Sodium: 135 mmol/L (ref 135–145)

## 2019-08-29 LAB — PROTEIN / CREATININE RATIO, URINE
Creatinine, Urine: 165.65 mg/dL
Protein Creatinine Ratio: 0.12 mg/mg{Cre} (ref 0.00–0.15)
Total Protein, Urine: 20 mg/dL

## 2019-08-29 LAB — TROPONIN I (HIGH SENSITIVITY)
Troponin I (High Sensitivity): 3 ng/L (ref <18)
Troponin I (High Sensitivity): <2 ng/L (ref <18)

## 2019-08-29 LAB — SARS CORONAVIRUS 2 BY RT PCR (HOSPITAL ORDER, PERFORMED IN ~~LOC~~ HOSPITAL LAB): SARS Coronavirus 2: NEGATIVE

## 2019-08-29 MED ORDER — ACETAMINOPHEN 325 MG PO TABS
650.0000 mg | ORAL_TABLET | Freq: Once | ORAL | Status: AC
  Administered 2019-08-29: 650 mg via ORAL
  Filled 2019-08-29: qty 2

## 2019-08-29 NOTE — Progress Notes (Signed)
70[name] called to [facility] for pt c/o headache and chest pain. Pt states she is five months pregnant.   7209[name] arrived to ED. Video interpretor called and obtained, pt states that she is no longer having chest pain, headache is tolerable, but the runny nose is what has started to bother her. She has had this cold x2days but it just began worsening this morning which brought her to the ED. No c/o bleeding, LOF, or ctx. States baby seems to be moving less, but she still does feel fetal movement at times. Pt states other pregnancies were normal vaginal births in [place] without complication. Pt also c/o vaginal pain upon certain movements. States she told OB this at last apt on 5/20, and they told her it could be from fetal position.   0448: Fetal heart tones doppler in 140s. Fetal movement present. Abdomen soft to palpation.  4709: Dr. Vergie Living called notified of pt G3P2 at 23.4 wks. Came in c/o headache and chest pain. States chest pain is now gone, headache is better, but the runny nose is "too much". No c/o bleeding, LOF, or ctx. States baby seems to be moving less, but she still does feel fetal movement at times. Pt states other pregnancies were normal vaginal births in [place] without complication. Pt also c/o vaginal pain upon certain movements. States she told OB this at last apt on 5/20, and they told her it could be from fetal position. Fetal heart tones doppler in 140s, abdomen soft to palpation. OB cleared at this time.

## 2019-08-29 NOTE — ED Provider Notes (Addendum)
[facility] EMERGENCY DEPARTMENT Provider Note   CSN: [id number] Arrival date & time: 08/28/19  2142     History Chief Complaint  Patient presents with  . Headache  . Chest Pain    Roberto Mitrano is a 29 y.o. female.  The history is provided by the patient. A language interpreter was used ([id number] - swahili).  Headache Pain location:  Generalized Onset quality:  Gradual Duration:  2 days Timing:  Constant Progression:  Unchanged Chronicity:  New Relieved by:  Nothing Worsened by:  Nothing Associated symptoms: congestion   Associated symptoms: no diarrhea and no sore throat    Patient is approximately [redacted] weeks pregnant.  She reports over the past 2 days she has had gradual headache and congestion.  No fevers or vomiting.  No visual changes.  No focal weakness.  No chest pain or shortness of breath.  No cough.  She was not vaccinated for COVID-19.  No abdominal pain or bleeding.  No complications with pregnancy so far    History reviewed. No pertinent past medical history.  Patient Active Problem List   Diagnosis Date Noted  . History of episiotomy 08/26/2019  . Nausea and vomiting during pregnancy 08/20/2019  . LGSIL on Pap smear of cervix 08/19/2019  . Herpes simplex virus infection 08/19/2019  . Positive antibody screen  08/19/2019  . Supervision of low-risk pregnancy 08/03/2019    History reviewed. No pertinent surgical history.   OB History    Gravida  3   Para  2   Term  2   Preterm  0   AB  0   Living  2     SAB      TAB      Ectopic      Multiple      Live Births  2           Family History  Problem Relation Age of Onset  . Healthy Mother   . Healthy Father   . Healthy Daughter   . Healthy Son   . Cancer Neg Hx   . Stroke Neg Hx     Social History   Tobacco Use  . Smoking status: Never Smoker  . Smokeless tobacco: Never Used  Substance Use Topics  . Alcohol use: Never  . Drug use: Never    Home  Medications Prior to Admission medications   Medication Sig Start Date End Date Taking? Authorizing Provider  Doxylamine-Pyridoxine (DICLEGIS) 10-10 MG TBEC Take 2 tablets at bedtime and increase as directed 08/20/19   Autry-Lott, Naaman Plummer, DO  famotidine (PEPCID) 40 MG tablet Take 0.5 tablets (20 mg total) by mouth 2 (two) times daily. 08/26/19   Martyn Malay, MD  Prenatal Vit-Fe Fumarate-FA (PRENATAL MULTIVITAMIN) TABS tablet Take 1 tablet by mouth daily at 12 noon. 08/03/19   Matilde Haymaker, MD    Allergies    Patient has no known allergies.  Review of Systems   Review of Systems  HENT: Positive for congestion. Negative for facial swelling, sinus pain and sore throat.   Eyes: Negative for visual disturbance.  Cardiovascular: Negative for leg swelling.  Gastrointestinal: Negative for diarrhea.  Genitourinary: Negative for vaginal bleeding.  Neurological: Positive for headaches.  All other systems reviewed and are negative.   Physical Exam Updated Vital Signs BP (!) 100/57 (BP Location: Left Arm)   Pulse 64   Temp 98 F (36.7 C) (Oral)   Resp 16   LMP 03/17/2019 (Approximate)   SpO2  100%   Physical Exam CONSTITUTIONAL: Well developed/well nourished HEAD: Normocephalic/atraumatic EYES: EOMI/PERRL ENMT: Mucous membranes moist NECK: supple no meningeal signs SPINE/BACK:entire spine nontender CV: S1/S2 noted, no murmurs/rubs/gallops noted LUNGS: Lungs are clear to auscultation bilaterally, no apparent distress ABDOMEN: soft, nontender, no rebound or guarding, bowel sounds noted throughout abdomen, gravid GU:no cva tenderness NEURO: Pt is awake/alert/appropriate, moves all extremitiesx4.  No facial droop.  No clonus, no patellar or Achilles hyperreflexia EXTREMITIES: pulses normal/equal, full ROM, no lower extremity edema SKIN: warm, color normal PSYCH: no abnormalities of mood noted, alert and oriented to situation  ED Results / Procedures / Treatments   Labs (all labs  ordered are listed, but only abnormal results are displayed) Labs Reviewed  BASIC METABOLIC PANEL - Abnormal; Notable for the following components:      Result Value   CO2 21 (*)    BUN <5 (*)    Creatinine, Ser 0.42 (*)    Calcium 8.4 (*)    All other components within normal limits  CBC - Abnormal; Notable for the following components:   RBC 3.64 (*)    Hemoglobin 10.8 (*)    HCT 32.5 (*)    All other components within normal limits  HEPATIC FUNCTION PANEL - Abnormal; Notable for the following components:   Total Protein 6.1 (*)    Albumin 2.9 (*)    Total Bilirubin <0.1 (*)    All other components within normal limits  SARS CORONAVIRUS 2 BY RT PCR (HOSPITAL ORDER, PERFORMED IN [facility])  PROTEIN / CREATININE RATIO, URINE  I-STAT BETA HCG BLOOD, ED ([facility], [facility], [facility] ONLY)  TROPONIN I (HIGH SENSITIVITY)  TROPONIN I (HIGH SENSITIVITY)    EKG EKG Interpretation  Date/Time:  Friday Aug 28 2019 22:18:46 EDT Ventricular Rate:  75 PR Interval:  160 QRS Duration: 86 QT Interval:  380 QTC Calculation: 424 R Axis:   66 Text Interpretation: Normal sinus rhythm Normal ECG Confirmed by Zadie Rhine ([id number]) on 08/29/2019 4:09:42 AM   Radiology DG Chest 2 View  Result Date: 08/28/2019 CLINICAL DATA:  Chest pain EXAM: CHEST - 2 VIEW COMPARISON:  11/01/2017 FINDINGS: The heart size and mediastinal contours are within normal limits. Both lungs are clear. The visualized skeletal structures are unremarkable. IMPRESSION: No active cardiopulmonary disease. Electronically Signed   By: Jasmine Pang M.D.   On: 08/28/2019 22:55    Procedures Procedures  Medications Ordered in ED Medications  sodium chloride flush (NS) 0.9 % injection 3 mL (has no administration in time range)  acetaminophen (TYLENOL) tablet 650 mg (has no administration in time range)    ED Course  I have reviewed the triage vital signs and the nursing notes.  Pertinent labs & imaging results that were  available during my care of the patient were reviewed by me and considered in my medical decision making (see chart for details).    MDM Rules/Calculators/A&P                     4:58 AM By the time of my evaluation patient already had EKG and chest x-ray performed. Work-up thus far is unremarkable.  Due to headache and over 20 weeks, will perform preeclampsia screen.  However with her congestion, suspicion this is a viral illness/URI.  Will screen for COVID-19.  Due to her pregnancy status, the rapid OB nurse will be called 6:48 AM Pt improved No distress Labs reassuring COVID still pending Cleared by OB perspective No signs of  preeclampsia.  No signs of stroke no signs of meningitis Pt denied CP on my eval, but told nursing she had it earlier Stable for d/c home  Daniya Schroth was evaluated in Emergency Department on 08/29/2019 for the symptoms described in the history of present illness. She was evaluated in the context of the global COVID-19 pandemic, which necessitated consideration that the patient might be at risk for infection with the SARS-CoV-2 virus that causes COVID-19. Institutional protocols and algorithms that pertain to the evaluation of patients at risk for COVID-19 are in a state of rapid change based on information released by regulatory bodies including the CDC and federal and state organizations. These policies and algorithms were followed during the patient's care in the ED.  Final Clinical Impression(s) / ED Diagnoses Final diagnoses:  Nasal congestion  Other headache syndrome    Rx / DC Orders ED Discharge Orders    None       Zadie Rhine, MD 08/29/19 [id number]    Zadie Rhine, MD 08/29/19 [phone]

## 2019-08-29 NOTE — ED Notes (Signed)
CALLED OB RAPID RESPONSE NURSE--LESLIE

## 2019-08-29 NOTE — ED Notes (Signed)
Fetal heart tones 145

## 2019-09-01 ENCOUNTER — Encounter: Payer: Self-pay | Admitting: Family Medicine

## 2019-09-01 ENCOUNTER — Ambulatory Visit (INDEPENDENT_AMBULATORY_CARE_PROVIDER_SITE_OTHER): Payer: Medicaid Other | Admitting: Family Medicine

## 2019-09-01 ENCOUNTER — Other Ambulatory Visit: Payer: Self-pay

## 2019-09-01 VITALS — BP 96/58 | HR 83 | Wt 142.0 lb

## 2019-09-01 DIAGNOSIS — Z3492 Encounter for supervision of normal pregnancy, unspecified, second trimester: Secondary | ICD-10-CM

## 2019-09-01 LAB — ANTIBODY SCREEN: Antibody Screen: NEGATIVE

## 2019-09-01 NOTE — Progress Notes (Signed)
  [facility] Prenatal Visit  Robin Cordova is a 29 y.o. G3P2002 at [redacted]w[redacted]d here for routine follow up. She is dated by LMP.  She reports no bleeding, no cramping and no leaking.  She reports good fetal movement. No bleeding, loss of fluid, contractions. See flow sheet for details. There were no vitals filed for this visit.   A/P: Pregnancy at [redacted]w[redacted]d.  Doing well.   . Dating reviewed, dating tab is correct . Fetal heart tones Appropriate . Fundal height within expected range.  . Anatomy ultrasound was reviewed and is normal anatomy.   . Indications for screening for preexisting diabetes include: Reviewed indications for early 1 hour glucose testing, not indicated .  Marland Kitchen Pregnancy education provided on the following topics: fetal growth and movement, ultrasound assessment, and upcoming laboratory assessment.   . Scheduled for [facility] during second trimester on 09/24/2019. . Preterm labor precautions given.   2. Pregnancy issues include the following and were addressed as appropriate today:  . Antibodies positive OB panel.  Will be rescreened today based on recommendations from the blood bank. . Problem list and pregnancy box updated: Yes.     Follow up 4 weeks.

## 2019-09-01 NOTE — Addendum Note (Signed)
Addended by: Manson Passey, Ludy Messamore on: 09/01/2019 04:13 PM   Modules accepted: Orders

## 2019-09-01 NOTE — Patient Instructions (Signed)
  Tafadhali rudi kliniki baada ya wiki 4 kwa ziara yako ijayo.  Please return to clinic in 4 weeks for your next visit.

## 2019-09-02 ENCOUNTER — Ambulatory Visit: Payer: Medicaid Other | Attending: Obstetrics and Gynecology

## 2019-09-02 ENCOUNTER — Other Ambulatory Visit: Payer: Self-pay | Admitting: *Deleted

## 2019-09-02 DIAGNOSIS — Z362 Encounter for other antenatal screening follow-up: Secondary | ICD-10-CM

## 2019-09-02 DIAGNOSIS — Z3A24 24 weeks gestation of pregnancy: Secondary | ICD-10-CM

## 2019-09-14 ENCOUNTER — Encounter: Payer: Medicaid Other | Admitting: Family Medicine

## 2019-09-24 ENCOUNTER — Ambulatory Visit (INDEPENDENT_AMBULATORY_CARE_PROVIDER_SITE_OTHER): Payer: Medicaid Other | Admitting: Family Medicine

## 2019-09-24 ENCOUNTER — Other Ambulatory Visit (HOSPITAL_COMMUNITY)
Admission: RE | Admit: 2019-09-24 | Discharge: 2019-09-24 | Disposition: A | Discharge status: Home / Self Care | Payer: Medicaid Other | Source: Ambulatory Visit | Attending: Family Medicine | Admitting: Family Medicine

## 2019-09-24 ENCOUNTER — Other Ambulatory Visit: Payer: Self-pay

## 2019-09-24 VITALS — BP 98/62 | HR 77 | Wt 144.0 lb

## 2019-09-24 DIAGNOSIS — O289 Unspecified abnormal findings on antenatal screening of mother: Secondary | ICD-10-CM

## 2019-09-24 DIAGNOSIS — Z3493 Encounter for supervision of normal pregnancy, unspecified, third trimester: Secondary | ICD-10-CM | POA: Diagnosis not present

## 2019-09-24 LAB — POCT WET PREP (WET MOUNT)
Clue Cells Wet Prep Whiff POC: POSITIVE
Trichomonas Wet Prep HPF POC: ABSENT

## 2019-09-24 MED ORDER — PRENATAL VITAMIN 27-0.8 MG PO TABS: ORAL_TABLET | ORAL | 1 refills | Status: DC

## 2019-09-24 MED ORDER — MICONAZOLE NITRATE 2 % VA CREA: 1.0000 | TOPICAL_CREAM | Freq: Every day | VAGINAL | 0 refills | Status: DC

## 2019-09-24 MED ORDER — METRONIDAZOLE 500 MG PO TABS
500.0000 mg | ORAL_TABLET | Freq: Two times a day (BID) | ORAL | 0 refills | Status: DC
Start: 2019-09-24 — End: 2019-12-10

## 2019-09-24 MED ORDER — PRENATAL MULTIVITAMIN CH: 1.0000 | ORAL_TABLET | Freq: Every day | ORAL | 1 refills | Status: DC

## 2019-09-24 NOTE — Progress Notes (Signed)
[facility] Visit  Robin Cordova is a 29 y.o. G3P2002 at [redacted]w[redacted]d (via LMP=21w u/s) who presents to [facility] for routine follow up. Prenatal course, history, notes, ultrasounds, and laboratory results reviewed. Swahili interpreter utilized during entirety of visit.  Denies cramping/ctx, fluid leaking, vaginal bleeding, or decreased fetal movement. Does endorse pain in lower pelvis over pubic symphysis, worse with walking and lying down. Also notes some vaginal itching. No specific discharge problems. Taking PNV but needs refill.  Primary Prenatal Care Provider: Dr. Mirian Mo  Postpartum Plans: - delivery planning: plans SVD without epidural - circumcision: yes - feeding: breast - pediatrician: unsure - advised she can bring baby here to [facility] for care if she'd like, can also help get her other kids established with [name] - contraception: depo  Exam: FHR: 140bpm Uterine size: 27cm GU: Patient localizes area of pain to be her pubic symphysis. Normal appearing external genitalia without lesions. Vagina is moist with moderate white thick clumpy discharge. Cervix normal in appearance and closed visually.  Assessment & Plan  1. Routine prenatal care: - gc/chlamydia done today (never had this done earlier in pregnancy) - 28 week labs (CBC, HIV, RPR) and glucola needed. Patient arrived too late for visit to complete GTT today. Lab visit scheduled for tomorrow AM to get these drawn and get 1hr GTT - varicella titers indicated due to unknown varicella immunity status, get at lab visit tomorrow - Tdap due today but unfortunately we did not have these in stock, will need at next visit - discussed current available info about COVID vaccine in pregnancy, encouraged her to ask all her close contacts to be vaccinated to protect her and her baby - refilled PNV  2. LSIL pap smear - needs postpartum colpo - discussed with patient at length today  3. HSV  positive serology - start valtrex suppression at 36w - discussed with patient at length today  4. Vaginal itching - exam consistent with yeast. Wet prep positive for both BV and yeast. rx for clotrimazole and metronidazole sent in.  5. Pubic symphysis pain - discussed etiology with patient. Recommend gentle stretching, tylenol. Note written for work so she can sit in a chair while working (stands for 8 hours per day at her job).  Resolved prior issues: - had positive antibody screen, repeat through [facility] was negative.  Next prenatal visit in 2 weeks. Labor & fetal movement precautions discussed.  Levert Feinstein, MD [facility]

## 2019-09-24 NOTE — Patient Instructions (Addendum)
It was great to see you again today!  Come back tomorrow morning at 9:30am for your labs  Sent in yeast infection cream to your pharmacy  Take tylenol, gentle stretching for your pain  Go to the MAU at [facility] if:  You have cramping/contractions that do not go away with drinking water  Your water breaks.  Sometimes it is a big gush of fluid, sometimes it is just a trickle that keeps getting your underwear wet or running down your legs  You have vaginal bleeding.     You do not feel your baby moving like normal.  If you do not, get something to eat and drink and lay down and focus on feeling your baby move. If your baby is still not moving like normal, you should go to MAU.   Next visit 2 weeks Will have someone call about scheduling your kids  Be well, Dr. Pollie Meyer

## 2019-09-25 ENCOUNTER — Other Ambulatory Visit (INDEPENDENT_AMBULATORY_CARE_PROVIDER_SITE_OTHER): Payer: Medicaid Other

## 2019-09-25 ENCOUNTER — Telehealth: Payer: Self-pay | Admitting: *Deleted

## 2019-09-25 DIAGNOSIS — Z3493 Encounter for supervision of normal pregnancy, unspecified, third trimester: Secondary | ICD-10-CM | POA: Diagnosis not present

## 2019-09-25 LAB — CERVICOVAGINAL ANCILLARY ONLY
Chlamydia: NEGATIVE
Comment: NEGATIVE
Comment: NEGATIVE
Comment: NORMAL
Neisseria Gonorrhea: NEGATIVE
Trichomonas: NEGATIVE

## 2019-09-25 LAB — POCT 1 HR PRENATAL GLUCOSE: Glucose 1 Hr Prenatal, POC: 79 mg/dL

## 2019-09-25 NOTE — Telephone Encounter (Signed)
Pt is here for a lab visit today.  She needs the letter was created yesterday to have an end date per her job.  Spoke with Dr. Pollie Meyer, verbal given to add [facility] date to letter.  Done and given to patient. Jone Baseman, CMA

## 2019-09-26 LAB — CBC
Hematocrit: 31 % — ABNORMAL LOW (ref 34.0–46.6)
Hemoglobin: 10.2 g/dL — ABNORMAL LOW (ref 11.1–15.9)
MCH: 28.2 pg (ref 26.6–33.0)
MCHC: 32.9 g/dL (ref 31.5–35.7)
MCV: 86 fL (ref 79–97)
Platelets: 255 10*3/uL (ref 150–450)
RBC: 3.62 x10E6/uL — ABNORMAL LOW (ref 3.77–5.28)
RDW: 12.9 % (ref 11.7–15.4)
WBC: 5.8 10*3/uL (ref 3.4–10.8)

## 2019-09-26 LAB — RPR: RPR Ser Ql: NONREACTIVE

## 2019-09-26 LAB — HIV ANTIBODY (ROUTINE TESTING W REFLEX): HIV Screen 4th Generation wRfx: NONREACTIVE

## 2019-09-26 LAB — VARICELLA ZOSTER ANTIBODY, IGG: Varicella zoster IgG: 571 index (ref >165)

## 2019-09-30 ENCOUNTER — Ambulatory Visit: Payer: Medicaid Other | Attending: Obstetrics and Gynecology

## 2019-09-30 ENCOUNTER — Other Ambulatory Visit: Payer: Self-pay

## 2019-09-30 DIAGNOSIS — Z3A28 28 weeks gestation of pregnancy: Secondary | ICD-10-CM

## 2019-09-30 DIAGNOSIS — Z362 Encounter for other antenatal screening follow-up: Secondary | ICD-10-CM | POA: Diagnosis not present

## 2019-10-01 ENCOUNTER — Telehealth: Payer: Self-pay | Admitting: Family Medicine

## 2019-10-01 DIAGNOSIS — Z419 Encounter for procedure for purposes other than remedying health state, unspecified: Secondary | ICD-10-CM | POA: Diagnosis not present

## 2019-10-01 DIAGNOSIS — O99019 Anemia complicating pregnancy, unspecified trimester: Secondary | ICD-10-CM | POA: Insufficient documentation

## 2019-10-01 DIAGNOSIS — O99013 Anemia complicating pregnancy, third trimester: Secondary | ICD-10-CM

## 2019-10-01 MED ORDER — CLOTRIMAZOLE 1 % VA CREA: 1.0000 | TOPICAL_CREAM | Freq: Every day | VAGINAL | 0 refills | Status: DC

## 2019-10-01 MED ORDER — FERROUS SULFATE 324 (65 FE) MG PO TBEC: 324.0000 mg | DELAYED_RELEASE_TABLET | ORAL | 1 refills | Status: DC

## 2019-10-01 NOTE — Telephone Encounter (Signed)
Called patient with interpreter to review labs Hgb low - will start iron 325mg  every other day - will send rx to pharmacy Patient reports vaginal cream never received from pharmacy - confirmed this was sent and received. I will send another rx.  Reviewed ultrasound results with patient - no further ultrasounds recommended by MFM office. Assisted her with scheduling her next OB appointment with Dr. on 7/8 Patient appreciative  9/8, MD

## 2019-10-08 ENCOUNTER — Encounter: Payer: Self-pay | Admitting: Family Medicine

## 2019-10-08 ENCOUNTER — Other Ambulatory Visit: Payer: Self-pay

## 2019-10-08 ENCOUNTER — Ambulatory Visit (INDEPENDENT_AMBULATORY_CARE_PROVIDER_SITE_OTHER): Payer: Medicaid Other | Admitting: Family Medicine

## 2019-10-08 VITALS — BP 99/60 | HR 85 | Wt 147.2 lb

## 2019-10-08 DIAGNOSIS — R05 Cough: Secondary | ICD-10-CM

## 2019-10-08 DIAGNOSIS — Z3493 Encounter for supervision of normal pregnancy, unspecified, third trimester: Secondary | ICD-10-CM

## 2019-10-08 DIAGNOSIS — R059 Cough, unspecified: Secondary | ICD-10-CM

## 2019-10-08 DIAGNOSIS — Z23 Encounter for immunization: Secondary | ICD-10-CM | POA: Diagnosis not present

## 2019-10-08 DIAGNOSIS — O99013 Anemia complicating pregnancy, third trimester: Secondary | ICD-10-CM

## 2019-10-08 NOTE — Patient Instructions (Addendum)
Hesabu ya Kick: - Kunywa kitu baridi au sukari - lala mahali pa utulivu kwa saa 1. - hesabu mateke kwa saa 1 - Inapaswa kuwa na mateke 10 kwa saa 1 - Alifundishwa pt kwamba ikiwa anahisi chini ya mateke 5 kwa wakati huu anapaswa kwenda MAU kwa tathmini   Sababu za kurudi MAU: 1. Vizuizi vimetengwa kwa dakika 5 au chini, kila dakika 1 ya [employer], hizi zimekuwa zikiendelea kwa masaa 1-2, na huwezi [place] au kuzungumza wakati wao 2. Una mtiririko mkubwa wa majimaji, au mtiririko wa maji ambayo hayatakoma na lazima uvae pedi 3. Una damu ambayo ni nyekundu nyekundu, nzito [place] - kama damu ya hedhi [phone] Fatima Sanger Herbie [place] leba ya mapema au baada ya Mateo Flow kizazi chako) 4. Hujisikii mtoto [employer]   Kick Count: - Drink something cold or sugary - lie in a quiet place for 1 hour.  - count kicks for 1 hour  - Should have 10 kicks in 1 hour - Instructed pt that if she feels less than 5 kicks in this time she should go to the MAU for evaluation   Reasons to return to MAU: 1.  Contractions are  5 minutes apart or less, each last 1 minute, these have been going on for 1-2 hours, and you cannot walk or talk during them 2.  You have a large gush of fluid, or a trickle of fluid that will not stop and you have to wear a pad 3.  You have bleeding that is bright red, heavier than spotting--like menstrual bleeding (spotting can be normal in early labor or after a check of your cervix) 4.  You do not feel the baby moving like he/she normally does  Safe Medications in Pregnancy   Acne: Benzoyl Peroxide Salicylic Acid  Backache/Headache: Tylenol: 2 regular strength every 4 hours OR              2 Extra strength every 6 hours  Colds/Coughs/Allergies: Benadryl (alcohol free) 25 mg every 6 hours as needed Breath right strips Claritin Cepacol throat lozenges Chloraseptic throat spray Cold-Eeze- up to three times per day Cough drops, alcohol  free Flonase (by prescription only) Guaifenesin Mucinex Robitussin DM (plain only, alcohol free) Saline nasal spray/drops Sudafed (pseudoephedrine) & Actifed ** use only after [redacted] weeks gestation and if you do not have high blood pressure Tylenol Vicks Vaporub Zinc lozenges Zyrtec   Constipation: Colace Ducolax suppositories Fleet enema Glycerin suppositories Metamucil Milk of magnesia Miralax Senokot Smooth move tea  Diarrhea: Kaopectate Imodium A-D  *NO pepto Bismol  Hemorrhoids: Anusol Anusol HC Preparation H Tucks  Indigestion: Tums Maalox Mylanta Zantac  Pepcid  Insomnia: Benadryl (alcohol free) 25mg  every 6 hours as needed Tylenol PM Unisom, no Gelcaps  Leg Cramps: Tums MagGel  Nausea/Vomiting:  Bonine Dramamine Emetrol Ginger extract Sea bands Meclizine  Nausea medication to take during pregnancy:  Unisom (doxylamine succinate 25 mg tablets) Take one tablet daily at bedtime. If symptoms are not adequately controlled, the dose can be increased to a maximum recommended dose of two tablets daily (1/2 tablet in the morning, 1/2 tablet mid-afternoon and one at bedtime). Vitamin B6 100mg  tablets. Take one tablet twice a day (up to 200 mg per day).  Skin Rashes: Aveeno products Benadryl cream or 25mg  every 6 hours as needed Calamine Lotion 1% cortisone cream  Yeast infection: Gyne-lotrimin 7 Monistat 7   **If taking multiple medications, please check labels to avoid duplicating the same active ingredients **take medication as directed  on the label ** Do not exceed 4000 mg of tylenol in 24 hours **Do not take medications that contain aspirin or ibuprofen

## 2019-10-08 NOTE — Progress Notes (Signed)
Patient Name: Elveta Sutter Date of Birth: Jun 07, 1990 Date of Visit: 10/08/19 PCP: Mirian Mo, MD Third Trimester Prenatal Visit   Chief Complaint: prenatal care  Subjective: Lateshia Delao is a pleasant G3P2002 at  [redacted]w[redacted]d dated by LMP=21w u/s. She has no unusual complaints today.  She denies vaginal bleeding or discharge. She reports good fetal movement. She denies contractions.   Cough, Congestion, Headache: Patient notes that she has had a nonproductive cough, some congestion, and mild headache starting yesterday.  Denies fevers or chills, nausea/vomiting. Denies productive cough.  Denies any sick contacts or Covid exposures.  She has not been Covid vaccinated  ROS: per HPI.   I have reviewed the patient's medical, surgical, family, and social history as appropriate.   Objective: Vitals:   10/08/19 1051  BP: 99/60  Pulse: 85   Last Weight  Most recent update: 10/08/2019 10:52 AM   Weight  66.8 kg (147 lb 3.2 oz)           See flow sheet for exam.  Fetal heart tones documented in flow sheet  Fundal height documented in flow sheet   General:  Alert, oriented and cooperative. Patient is in no acute distress.  Skin HEENT:: Skin is warm and dry. No rash noted.  Throat clear without erythema or exudate, mild cobblestoning   Cardiovascular: Normal heart rate noted  Respiratory: Normal respiratory effort, no problems with respiration noted, lungs clear throughout  Abdomen: Soft, gravid, appropriate for gestational age.  Pain/Pressure: Absent     Pelvic: Cervical exam deferred        Extremities: Normal range of motion.     Mental Status: Normal mood and affect. Normal behavior. Normal judgment and thought content.   Depression screen [facility] 2/9 10/08/2019 10/08/2019 09/24/2019  Decreased Interest 1 0 0  Down, Depressed, Hopeless 0 0 0  PHQ - 2 Score 1 0 0  Altered sleeping 0 - -  Tired, decreased energy 0 - -  Change in appetite 0 - -  Feeling bad or failure about yourself  0 - -    Trouble concentrating 0 - -  Moving slowly or fidgety/restless 0 - -  Suicidal thoughts 0 - -  PHQ-9 Score 1 - -   Assessment/Plan: Courtni Linville is a pleasant G3P2002 at [redacted]w[redacted]d presenting for routine third trimester prenatal care. Overall she is doing well.   . Dating reviewed, dating tab is correct . Fetal heart tones Appropriate . Fundal height within expected range.  . Pregnancy issues include: o Anemia affecting pregnancy o History of episiotomy o History of HSV infection o LGSIL on Pap - colpo postpartum . Problem list updated Yes.  . Infant feeding choice: Both  . Contraception choice: Depo-Provera . Infant circumcision desired yes  . The patient does not have a history of Cesarean delivery and no referral to Center for [facility] is indicated . Influenza vaccine not administered as not influenza season.   . Tdap was given today. . 1 hour glucola, CBC, RPR, and HIV completed at last check up.   . Pregnancy medical home and PHQ-9 forms were done today and reviewed.   . Rh status was reviewed and patient does not need Rhogam.  Rhogam was not given today.   . Childbirth and education classes were not offered. . Pregnancy education regarding benefits of breastfeeding, contraception, fetal growth, expected weight gain, and safe infant sleep were discussed.  . Preterm labor and fetal movement precautions reviewed. . Patient will need to be schedule  in Faculty OB clinic. No availability at time of this appointment.  #1: History of HSV: will need acyclovir at 36 weeks  #2: Anemia affecting Pregnancy: continue iron 325mg  supplement  #3: LGSIL on Pap: colpo postpartum  #4: Cough, Congestion, Headache: No signs of acute infection on history or physical exam.  Reassurance provided.  Recommended supportive therapy including Mucinex, humidifier, Tylenol as needed for headache, honey, and cough drops.  She should RTC if worsening symptoms.  Strict return precautions  discussed.  #5: Vaginal Itching: improved with cream  Anticipatory Guidance and Prenatal Education provided on the following topics: - Recommended continuing Prenatal vitamin and iron supplement - Discussed options for Contraception postpartum: depo  - Discussed mode of delivery and pain control during labor: prefers SVD without epidural  - Discussed  breastfeeding and benefits for infant  - Discussed safe sleep recommendations and car seat recommendations  - Problem list and prenatal box updated   Follow up in 2 weeks for routine prenatal visit.  Due to language barrier, an interpreter was present during the history-taking and subsequent discussion (and for part of the physical exam) with this patient.  Swahili interpreter: Kelton   #[id number], DO [facility], PGY2 10/08/2019 11:22 AM

## 2019-10-13 ENCOUNTER — Ambulatory Visit (HOSPITAL_COMMUNITY)
Admission: EM | Admit: 2019-10-13 | Discharge: 2019-10-13 | Disposition: A | Discharge status: Home / Self Care | Payer: Medicaid Other | Attending: Emergency Medicine | Admitting: Emergency Medicine

## 2019-10-13 ENCOUNTER — Other Ambulatory Visit: Payer: Self-pay

## 2019-10-13 ENCOUNTER — Encounter (HOSPITAL_COMMUNITY): Payer: Self-pay | Admitting: Emergency Medicine

## 2019-10-13 DIAGNOSIS — R519 Headache, unspecified: Secondary | ICD-10-CM | POA: Diagnosis not present

## 2019-10-13 DIAGNOSIS — O99013 Anemia complicating pregnancy, third trimester: Secondary | ICD-10-CM | POA: Insufficient documentation

## 2019-10-13 DIAGNOSIS — O26893 Other specified pregnancy related conditions, third trimester: Secondary | ICD-10-CM | POA: Diagnosis not present

## 2019-10-13 DIAGNOSIS — O212 Late vomiting of pregnancy: Secondary | ICD-10-CM | POA: Insufficient documentation

## 2019-10-13 DIAGNOSIS — U071 COVID-19: Secondary | ICD-10-CM | POA: Insufficient documentation

## 2019-10-13 DIAGNOSIS — O98513 Other viral diseases complicating pregnancy, third trimester: Secondary | ICD-10-CM | POA: Diagnosis not present

## 2019-10-13 DIAGNOSIS — Z3A30 30 weeks gestation of pregnancy: Secondary | ICD-10-CM | POA: Insufficient documentation

## 2019-10-13 DIAGNOSIS — D649 Anemia, unspecified: Secondary | ICD-10-CM | POA: Insufficient documentation

## 2019-10-13 DIAGNOSIS — J069 Acute upper respiratory infection, unspecified: Secondary | ICD-10-CM | POA: Insufficient documentation

## 2019-10-13 DIAGNOSIS — R05 Cough: Secondary | ICD-10-CM | POA: Insufficient documentation

## 2019-10-13 MED ORDER — FLUTICASONE PROPIONATE 50 MCG/ACT NA SUSP: 1.0000 | Freq: Every day | NASAL | 0 refills | Status: DC

## 2019-10-13 MED ORDER — DM-GUAIFENESIN ER 30-600 MG PO TB12
1.0000 | ORAL_TABLET | Freq: Two times a day (BID) | ORAL | 0 refills | Status: DC
Start: 2019-10-13 — End: 2019-12-10

## 2019-10-13 NOTE — ED Triage Notes (Signed)
PT reports cough and headache that started one week ago.

## 2019-10-13 NOTE — ED Provider Notes (Signed)
[facility]    CSN: [id number] Arrival date & time: 10/13/19  1143      History   Chief Complaint Chief Complaint  Patient presents with  . Cough  . Headache    HPI Robin Cordova is a 29 y.o. female [redacted] weeks pregnant presenting today for evaluation of cough and headache.  Patient reports that for approximately 1 week she has had a cough that is occasionally productive.  She also reports associated headache.  She denies any sore throat or rhinorrhea.  Denies abdominal pain or diarrhea.  Has had some mild nausea and vomiting which was for only 1 day.  She denies any fevers.  Denies close sick contacts.  Has not been using any medicines for symptoms.  HPI  History reviewed. No pertinent past medical history.  Patient Active Problem List   Diagnosis Date Noted  . Anemia affecting pregnancy 10/01/2019  . History of episiotomy 08/26/2019  . LGSIL on Pap smear of cervix 08/19/2019  . Herpes simplex virus infection 08/19/2019  . Positive antibody screen  08/19/2019  . Supervision of low-risk pregnancy 08/03/2019    History reviewed. No pertinent surgical history.  OB History    Gravida  3   Para  2   Term  2   Preterm  0   AB  0   Living  2     SAB      TAB      Ectopic      Multiple      Live Births  2            Home Medications    Prior to Admission medications   Medication Sig Start Date End Date Taking? Authorizing Provider  clotrimazole (CLOTRIMAZOLE-7) 1 % vaginal cream Place 1 Applicatorful vaginally at bedtime. 10/01/19  Yes Latrelle Dodrill, MD  ferrous sulfate 324 (65 Fe) MG TBEC Take 1 tablet (324 mg total) by mouth every other day. 10/01/19  Yes Latrelle Dodrill, MD  metroNIDAZOLE (FLAGYL) 500 MG tablet Take 1 tablet (500 mg total) by mouth 2 (two) times daily. For 7 days. Do not drink alcohol while taking this medication. 09/24/19  Yes Latrelle Dodrill, MD  Prenatal Vit-Fe Fumarate-FA (PRENATAL MULTIVITAMIN) TABS tablet  Take 1 tablet by mouth daily at 12 noon. 09/24/19  Yes Latrelle Dodrill, MD  dextromethorphan-guaiFENesin [facility] DM) 30-600 MG 12hr tablet Take 1 tablet by mouth 2 (two) times daily. 10/13/19   Chenoa Luddy C, PA-C  fluticasone (FLONASE) 50 MCG/ACT nasal spray Place 1 spray into both nostrils daily for 7 days. 10/13/19 10/20/19  Ishanvi Mcquitty, Junius Creamer, PA-C  Prenatal Vit-Fe Fumarate-FA (PRENATAL VITAMIN) 27-0.8 MG TABS Take 1 tablet by mouth daily 09/24/19   Latrelle Dodrill, MD    Family History Family History  Problem Relation Age of Onset  . Healthy Mother   . Healthy Father   . Healthy Daughter   . Healthy Son   . Cancer Neg Hx   . Stroke Neg Hx     Social History Social History   Tobacco Use  . Smoking status: Never Smoker  . Smokeless tobacco: Never Used  Substance Use Topics  . Alcohol use: Never  . Drug use: Never     Allergies   Patient has no known allergies.   Review of Systems Review of Systems  Constitutional: Negative for activity change, appetite change, chills, fatigue and fever.  HENT: Positive for congestion. Negative for ear pain, rhinorrhea, sinus pressure,  sore throat and trouble swallowing.   Eyes: Negative for discharge and redness.  Respiratory: Positive for cough. Negative for chest tightness and shortness of breath.   Cardiovascular: Negative for chest pain and leg swelling.  Gastrointestinal: Negative for abdominal pain, diarrhea, nausea and vomiting.  Musculoskeletal: Negative for myalgias.  Skin: Negative for rash.  Neurological: Positive for headaches. Negative for dizziness and light-headedness.     Physical Exam Triage Vital Signs ED Triage Vitals  Enc Vitals Group     BP      Pulse      Resp      Temp      Temp src      SpO2      Weight      Height      Head Circumference      Peak Flow      Pain Score      Pain Loc      Pain Edu?      Excl. in GC?    No data found.  Updated Vital Signs BP (!) 95/58   Pulse 79    Temp 98.8 F (37.1 C) (Oral)   Resp 16   LMP 03/17/2019 (Approximate)   SpO2 100%   Visual Acuity Right Eye Distance:   Left Eye Distance:   Bilateral Distance:    Right Eye Near:   Left Eye Near:    Bilateral Near:     Physical Exam Vitals and nursing note reviewed.  Constitutional:      Appearance: She is well-developed.     Comments: No acute distress  HENT:     Head: Normocephalic and atraumatic.     Ears:     Comments: Bilateral ears without tenderness to palpation of external auricle, tragus and mastoid, EAC's without erythema or swelling, TM's with good bony landmarks and cone of light. Non erythematous.     Nose: Nose normal.     Comments: Nasal mucosa erythematous with swollen turbinates bilaterally    Mouth/Throat:     Comments: Oral mucosa pink and moist, no tonsillar enlargement or exudate. Posterior pharynx patent and nonerythematous, no uvula deviation or swelling. Normal phonation. Eyes:     Conjunctiva/sclera: Conjunctivae normal.  Cardiovascular:     Rate and Rhythm: Normal rate.  Pulmonary:     Effort: Pulmonary effort is normal. No respiratory distress.     Comments: Breathing comfortably at rest, CTABL, no wheezing, rales or other adventitious sounds auscultated Abdominal:     General: There is no distension.  Musculoskeletal:        General: Normal range of motion.     Cervical back: Neck supple.  Skin:    General: Skin is warm and dry.  Neurological:     Mental Status: She is alert and oriented to person, place, and time.      UC Treatments / Results  Labs (all labs ordered are listed, but only abnormal results are displayed) Labs Reviewed  NOVEL CORONAVIRUS, NAA (HOSP ORDER, SEND-OUT TO REF LAB; TAT 18-24 HRS)    EKG   Radiology No results found.  Procedures Procedures (including critical care time)  Medications Ordered in UC Medications - No data to display  Initial Impression / Assessment and Plan / UC Course  I have  reviewed the triage vital signs and the nursing notes.  Pertinent labs & imaging results that were available during my care of the patient were reviewed by me and considered in my medical decision making (see chart  for details).     [redacted] week pregnant with URI symptoms, vital signs stable, lungs clear, Covid test pending.  Recommending continued symptomatic and supportive care for likely viral etiology.  Discussed medicine safe in pregnancy and provide recommendations. . Discussed strict return precautions. Patient verbalized understanding and is agreeable with plan.  Final Clinical Impressions(s) / UC Diagnoses   Final diagnoses:  Viral URI with cough     Discharge Instructions     COVID test pending Tylenol for headache May use mucinex dm twice daily to help with congestion and cough Flonase nasal spray for sinus pressure Rest and fluids Follow up if not improving or worsening    ED Prescriptions    Medication Sig Dispense Auth. Provider   dextromethorphan-guaiFENesin (MUCINEX DM) 30-600 MG 12hr tablet Take 1 tablet by mouth 2 (two) times daily. 14 tablet Layney Gillson C, PA-C   fluticasone (FLONASE) 50 MCG/ACT nasal spray Place 1 spray into both nostrils daily for 7 days. 1 g Diar Berkel, [place] C, PA-C     PDMP not reviewed this encounter.   Lew Dawes, [place] 10/14/19 [phone]

## 2019-10-13 NOTE — Discharge Instructions (Addendum)
COVID test pending Tylenol for headache May use mucinex dm twice daily to help with congestion and cough Flonase nasal spray for sinus pressure Rest and fluids Follow up if not improving or worsening

## 2019-10-14 ENCOUNTER — Encounter: Payer: Medicaid Other | Admitting: Family Medicine

## 2019-10-14 LAB — NOVEL CORONAVIRUS, NAA (HOSP ORDER, SEND-OUT TO REF LAB; TAT 18-24 HRS): SARS-CoV-2, NAA: DETECTED — AB

## 2019-10-15 ENCOUNTER — Telehealth: Payer: Self-pay

## 2019-10-15 ENCOUNTER — Telehealth: Payer: Self-pay | Admitting: Physician Assistant

## 2019-10-15 NOTE — Telephone Encounter (Signed)
Robin Cordova, from [facility], calls nurse line reporting positive covid test. Robin Cordova is making [name] aware due to pregnancy. Robin Cordova stated the patient has tried to reach out to her "OB" with no response. It looks like we are her OB provider and I do not see any notes where patient has tried to call. Patient is scheduled for next OB visit on 7/22, which will put her right at her quarantine restrictions. However, patient started showing sxs one ago from 7/13. Will forward to PCP as an FYI.

## 2019-10-15 NOTE — Telephone Encounter (Signed)
Called to discuss with patient about Covid symptoms and the use of bamlanivimab/etesevimab or casirivimab/imdevimab, a monoclonal antibody infusion for those with mild to moderate Covid symptoms and at a high risk of hospitalization.  Pt is qualified for this infusion at the [facility] due to pregnancy   Not able to leave a message- no VM set up.   Cline Crock PA-C  [facility]

## 2019-10-19 NOTE — Telephone Encounter (Signed)
Ms. Robin Cordova was called to follow-up regarding her recent Covid positive test.  She reports that she already feels significantly improved and is coughing less than she had been.  She is not having any trouble breathing at all.  She was informed that she will have some additional testing in the coming weeks (2 additional ultrasounds) because she tested positive for Covid.  She acknowledged understanding.  We canceled her appointment on 7/22 because it fell within her infectious window and rescheduled her with an appointment on 7/25.  She was informed of the scheduling changes.

## 2019-10-22 ENCOUNTER — Encounter: Payer: Medicaid Other | Admitting: Family Medicine

## 2019-10-27 ENCOUNTER — Encounter: Payer: Self-pay | Admitting: Family Medicine

## 2019-10-27 ENCOUNTER — Encounter: Payer: Medicaid Other | Admitting: Family Medicine

## 2019-10-27 NOTE — Progress Notes (Deleted)
  [facility] Prenatal Visit  Robin Cordova is a 29 y.o. G3P2002 at [redacted]w[redacted]d here for routine follow up. She is dated by {Ob dating:14516}.  She reports {symptoms; pregnancy related:14538}.  She reports fetal movement. She denies vaginal bleeding, contractions, or loss of fluid.  See flow sheet for details.  There were no vitals filed for this visit.   A/P: Pregnancy at [redacted]w[redacted]d.  Doing well.   1. Routine prenatal care:  Marland Kitchen Dating reviewed, dating tab is correct . Fetal heart tones: {appropriate:23337} . Fundal height: {fundal height:23342::"within expected range. "} . The patient {does/does not:200015::"does not"} have a history of HSV and valacyclovir {ACTION; IS/IS NOT:21021397::"is not"} indicated at this time.  . The patient {CWHCS:23409::"does not have a history of Cesarean delivery and no referral to [facility] is indicated"} . Infant feeding choice: {Breasfeeding:23347::"Breastfeeding"} . Contraception choice: {postpartum contraception:23348} . Infant circumcision desired {Response; yes/no/na:63} . Influenza vaccine {given:23340}  . Tdap {was/was not:19854::"was"} given today. . Childbirth and education classes {WERE / WERE WIO:03559} offered. . Pregnancy education regarding benefits of breastfeeding, contraception, fetal growth, expected weight gain, and safe infant sleep were discussed.  . Preterm labor and fetal movement precautions reviewed.   2. Pregnancy issues include the following and were addressed as appropriate today: . Anemia - continue QOD iron        COVID -  . Problem list and pregnancy box updated: {yes/no:20286::"Yes"}.   Scheduled for Ob Faculty clinic in third trimester on ***.   Follow up 2 weeks.

## 2019-11-01 DIAGNOSIS — Z419 Encounter for procedure for purposes other than remedying health state, unspecified: Secondary | ICD-10-CM | POA: Diagnosis not present

## 2019-11-03 ENCOUNTER — Other Ambulatory Visit: Payer: Self-pay

## 2019-11-03 ENCOUNTER — Ambulatory Visit (INDEPENDENT_AMBULATORY_CARE_PROVIDER_SITE_OTHER): Payer: Medicaid Other | Admitting: Family Medicine

## 2019-11-03 VITALS — BP 100/60 | HR 84 | Wt 149.2 lb

## 2019-11-03 DIAGNOSIS — Z3493 Encounter for supervision of normal pregnancy, unspecified, third trimester: Secondary | ICD-10-CM

## 2019-11-03 DIAGNOSIS — U071 COVID-19: Secondary | ICD-10-CM

## 2019-11-03 NOTE — Progress Notes (Signed)
  [facility] Prenatal Visit  Robin Cordova is a 29 y.o. G3P2002 at [redacted]w[redacted]d here for routine follow up. She is dated by LMP.  She reports no complaints.  She reports fetal movement. She denies vaginal bleeding, contractions, or loss of fluid.  See flow sheet for details.  She reports that she previously had some shortness of breath and mild chest pain while she had the COVID-19 infection but she has not had any issues for the past week and she feels totally recovered.  Vitals:   11/03/19 0841  BP: 100/60  Pulse: 84     A/P: Pregnancy at [redacted]w[redacted]d.  Doing well.   1. Routine prenatal care:  Marland Kitchen Dating reviewed, dating tab is correct . Fetal heart tones: Appropriate . Fundal height: within expected range.  . The patient does have a history of HSV and valacyclovir is not indicated at this time.  We'll plan to start prophylactic acyclovir at 36 weeks. . The patient does not have a history of Cesarean delivery and no referral to [facility] is indicated . Infant feeding choice: Breastfeeding . Contraception choice: Depo-Provera . Infant circumcision desired yes . Influenza vaccine not administered as not influenza season.   . Tdap was not given today. Given previously. . Childbirth and education classes were not offered. . Pregnancy education regarding benefits of breastfeeding, contraception, fetal growth, expected weight gain, and safe infant sleep were discussed.  . Preterm labor and fetal movement precautions reviewed.   2. Pregnancy issues include the following and were addressed as appropriate today: . Previous COVID-19 infection: Per current protocol, we will have a BPP performed at 32 and 36 weeks.  BPP scheduled prior to patient leaving clinic today.        Anemia: On iron pills every other day        History of HSV: We will begin prophylactic acyclovir at 36 weeks . Problem list and pregnancy box updated: Yes.   OB faculty was not available in 2 weeks.   She was scheduled to follow-up with me for a 35-week visit.   Follow up 2 weeks.

## 2019-11-03 NOTE — Patient Instructions (Addendum)
It was great to see you today.  I am glad that everything is going so well.  We will be doing some extra ultrasounds because you had a Covid infection during her pregnancy.  We will make sure that we schedule you for you before you leave the office today.  Please come back to clinic in 2 weeks for your next appointment.  Ilikuwa nzuri [employer]. Nafurahi kuwa kila kitu kinaenda sawa. Tutakuwa tukifanya nyongeza za ziada kwa sababu [place] na maambukizo ya Covid wakati wa ujauzito wake. Darnelle Spangle kwamba tunakupangia kabla ya kuondoka ofisini leo. Tafadhali rudi [employer] 2 kwa miadi yako ijayo.

## 2019-11-04 ENCOUNTER — Ambulatory Visit: Payer: Medicaid Other | Attending: Obstetrics and Gynecology

## 2019-11-04 ENCOUNTER — Ambulatory Visit: Payer: Medicaid Other | Admitting: *Deleted

## 2019-11-04 ENCOUNTER — Other Ambulatory Visit: Payer: Self-pay | Admitting: *Deleted

## 2019-11-04 ENCOUNTER — Other Ambulatory Visit: Payer: Self-pay | Admitting: Family Medicine

## 2019-11-04 VITALS — BP 104/64 | HR 96

## 2019-11-04 DIAGNOSIS — O99019 Anemia complicating pregnancy, unspecified trimester: Secondary | ICD-10-CM | POA: Diagnosis not present

## 2019-11-04 DIAGNOSIS — Z8616 Personal history of COVID-19: Secondary | ICD-10-CM

## 2019-11-04 DIAGNOSIS — U071 COVID-19: Secondary | ICD-10-CM | POA: Insufficient documentation

## 2019-11-04 DIAGNOSIS — O2990 Unspecified complication of anesthesia during pregnancy, unspecified trimester: Secondary | ICD-10-CM

## 2019-11-04 DIAGNOSIS — O99013 Anemia complicating pregnancy, third trimester: Secondary | ICD-10-CM | POA: Diagnosis not present

## 2019-11-04 DIAGNOSIS — D649 Anemia, unspecified: Secondary | ICD-10-CM | POA: Diagnosis not present

## 2019-11-04 DIAGNOSIS — Z362 Encounter for other antenatal screening follow-up: Secondary | ICD-10-CM | POA: Diagnosis not present

## 2019-11-04 DIAGNOSIS — Z3A33 33 weeks gestation of pregnancy: Secondary | ICD-10-CM

## 2019-11-20 ENCOUNTER — Other Ambulatory Visit: Payer: Self-pay

## 2019-11-20 ENCOUNTER — Ambulatory Visit (INDEPENDENT_AMBULATORY_CARE_PROVIDER_SITE_OTHER): Payer: Medicaid Other | Admitting: Family Medicine

## 2019-11-20 VITALS — BP 102/58 | HR 80 | Wt 150.2 lb

## 2019-11-20 DIAGNOSIS — L299 Pruritus, unspecified: Secondary | ICD-10-CM | POA: Diagnosis not present

## 2019-11-20 DIAGNOSIS — Z3483 Encounter for supervision of other normal pregnancy, third trimester: Secondary | ICD-10-CM

## 2019-11-20 MED ORDER — VALACYCLOVIR HCL 500 MG PO TABS: 500.0000 mg | ORAL_TABLET | Freq: Two times a day (BID) | ORAL | 1 refills | Status: AC

## 2019-11-20 MED ORDER — HYDROCORTISONE 1 % EX OINT
1.0000 | TOPICAL_OINTMENT | Freq: Two times a day (BID) | CUTANEOUS | 0 refills | Status: DC
Start: 2019-11-20 — End: 2019-11-23

## 2019-11-20 NOTE — Patient Instructions (Signed)
Itching: We will get a blood test today to make sure there is no dangerous cause of your itching.  For now, you can use the topical hydrocortisone to put of your stomach to help with the itching.  Ultrasound: Remember that you have an ultrasound appointment on 9/1.  I will see you again in 2 weeks.  Johnston EbbsMarcha Solders damu leo ??ili Linton Flemings BULAGT sababu hatari ya [place]. Levert Feinstein, unaweza kutumia hydrocortisone ya [place] tumbo lako [place].  Ultrasound: Kumbuka kuwa una miadi ya ultrasound mnamo 9/1.  Nitakuona tena baada ya wiki 2.

## 2019-11-20 NOTE — Progress Notes (Signed)
  [facility] Prenatal Visit  Majestic Benison is a 29 y.o. G3P2002 at [redacted]w[redacted]d for routine follow up.  She reports no concerns at this time. She reports fetal movement. She denies vaginal bleeding, contractions, or loss of fluid.   See flow sheet for details.  Vitals:   11/20/19 1022  BP: (!) 102/58  Pulse: 80     A/P: Pregnancy at [redacted]w[redacted]d.  Doing well.   1. Routine prenatal care:  Marland Kitchen Infant feeding choice: breast . Contraception choice: depo . Infant circumcision desired yes . Tdapwas not given today. Previously given . GBS/GC/CZ testing was not performed today, too early for GBS, will perform at next visit. . Preterm labor precautions reviewed.   2. Pregnancy issues include the following and were addressed as appropriate today: . Covid infection during pregnancy: Now resolved.  Has been scheduled for additional BPP's per protocol.  Last BPP was appropriate.  Next BPP scheduled for 9/1.        Anemia: on iron pills        HSV: start daily valtrex at 36 weeks. . Problem list and pregnancy box updated: Yes.   Follow up in 2 weeks.

## 2019-11-21 LAB — COMPREHENSIVE METABOLIC PANEL
ALT: 7 IU/L (ref 0–32)
AST: 14 IU/L (ref 0–40)
Albumin/Globulin Ratio: 1.1 — ABNORMAL LOW (ref 1.2–2.2)
Albumin: 3.1 g/dL — ABNORMAL LOW (ref 3.9–5.0)
Alkaline Phosphatase: 183 IU/L — ABNORMAL HIGH (ref 48–121)
BUN/Creatinine Ratio: 16 (ref 9–23)
BUN: 5 mg/dL — ABNORMAL LOW (ref 6–20)
Bilirubin Total: <0.2 mg/dL (ref 0.0–1.2)
CO2: 16 mmol/L — ABNORMAL LOW (ref 20–29)
Calcium: 8.9 mg/dL (ref 8.7–10.2)
Chloride: 105 mmol/L (ref 96–106)
Creatinine, Ser: 0.32 mg/dL — ABNORMAL LOW (ref 0.57–1.00)
GFR calc Af Amer: 176 mL/min/{1.73_m2} (ref >59)
GFR calc non Af Amer: 153 mL/min/{1.73_m2} (ref >59)
Globulin, Total: 2.9 g/dL (ref 1.5–4.5)
Glucose: 88 mg/dL (ref 65–99)
Potassium: 3.3 mmol/L — ABNORMAL LOW (ref 3.5–5.2)
Sodium: 132 mmol/L — ABNORMAL LOW (ref 134–144)
Total Protein: 6 g/dL (ref 6.0–8.5)

## 2019-11-23 ENCOUNTER — Other Ambulatory Visit: Payer: Self-pay | Admitting: Family Medicine

## 2019-11-23 MED ORDER — HYDROCORTISONE 1 % EX OINT: 1.0000 "application " | TOPICAL_OINTMENT | Freq: Two times a day (BID) | CUTANEOUS | 3 refills | Status: AC

## 2019-12-02 ENCOUNTER — Ambulatory Visit: Payer: Medicaid Other | Attending: Obstetrics and Gynecology

## 2019-12-02 ENCOUNTER — Ambulatory Visit: Payer: Medicaid Other | Admitting: *Deleted

## 2019-12-02 ENCOUNTER — Other Ambulatory Visit: Payer: Self-pay

## 2019-12-02 VITALS — BP 115/72 | HR 94

## 2019-12-02 DIAGNOSIS — D649 Anemia, unspecified: Secondary | ICD-10-CM

## 2019-12-02 DIAGNOSIS — Z362 Encounter for other antenatal screening follow-up: Secondary | ICD-10-CM

## 2019-12-02 DIAGNOSIS — Z419 Encounter for procedure for purposes other than remedying health state, unspecified: Secondary | ICD-10-CM | POA: Diagnosis not present

## 2019-12-02 DIAGNOSIS — Z3A37 37 weeks gestation of pregnancy: Secondary | ICD-10-CM

## 2019-12-02 DIAGNOSIS — O99019 Anemia complicating pregnancy, unspecified trimester: Secondary | ICD-10-CM | POA: Insufficient documentation

## 2019-12-02 DIAGNOSIS — Z8616 Personal history of COVID-19: Secondary | ICD-10-CM | POA: Diagnosis not present

## 2019-12-02 DIAGNOSIS — O2693 Pregnancy related conditions, unspecified, third trimester: Secondary | ICD-10-CM | POA: Diagnosis not present

## 2019-12-02 DIAGNOSIS — O99013 Anemia complicating pregnancy, third trimester: Secondary | ICD-10-CM | POA: Diagnosis not present

## 2019-12-04 ENCOUNTER — Other Ambulatory Visit (HOSPITAL_COMMUNITY)
Admission: RE | Admit: 2019-12-04 | Discharge: 2019-12-04 | Disposition: A | Discharge status: Home / Self Care | Payer: Medicaid Other | Source: Ambulatory Visit | Attending: Family Medicine | Admitting: Family Medicine

## 2019-12-04 ENCOUNTER — Other Ambulatory Visit: Payer: Self-pay

## 2019-12-04 ENCOUNTER — Ambulatory Visit (INDEPENDENT_AMBULATORY_CARE_PROVIDER_SITE_OTHER): Payer: Medicaid Other | Admitting: Family Medicine

## 2019-12-04 VITALS — BP 108/70 | HR 97 | Wt 156.8 lb

## 2019-12-04 DIAGNOSIS — Z3493 Encounter for supervision of normal pregnancy, unspecified, third trimester: Secondary | ICD-10-CM

## 2019-12-04 NOTE — Patient Instructions (Addendum)
It was so good to see you today.  It looks like your pregnancy is going very well.  From this point on you could have a healthy baby at any time.  Make sure that you have some bags ready to go to the hospital.  Be sure to go to the hospital if you have vaginal bleeding, decreased fetal movement or if you think that your water has broken.  If you do not deliver in the next week, we will see you next week in clinic.  Ilikuwa nzuri sana [place] leo. Inaonekana ujauzito wako unaenda vizuri sana. [place]aceo Pro wakati huu na kuendelea unaweza kuwa na mtoto mwenye afya wakati wowote. Hakikisha una mifuko tayari kwenda hospitalini. Lelon Huh kwenda hospitalini ikiwa una damu [place] kwa harakati za fetusi au ikiwa unafikiria kuwa maji yako yamevunjika.  Ikiwa hautatoa katika wiki ijayo, tutakuona wiki ijayo kliniki.

## 2019-12-04 NOTE — Progress Notes (Signed)
Robin Cordova is a 29 y.o. G3P2002 at [redacted]w[redacted]d here for routine follow up.  She reports some sharp pelvic pain with certain leg position but no vaginal bleeding, no gush of fluid, no contractions.  She reports good fetal movement. See flow sheet for details.   BP 108/70   Pulse 97   Wt 156 lb 12.8 oz (71.1 kg)   LMP 03/17/2019 (Approximate)   BMI 30.62 kg/m   Physical exam: Respiratory: Breathing comfortably on room air. Fundal height: 36 cm Fetal heart rate: 130s  Ultrasound: Cephalic presentation confirmed  A/P: Pregnancy at [redacted]w[redacted]d. Doing well.   Pregnancy issues include HSV seropositive, COVID infection in pregnancy and iron deficiency anemia.  Routine prenatal care GBS and gc/chlamydia testing results were done today.    COVID positive:   Last BPP 8/8. Good fetal movement. No further BPPs necessary per our protocol.  Iron deficiency anemia:  continue QOD iron  HSV seropositive:  unclear history of vaginal lesions. Currently taking valtrex. Continue BID valtrex.  Labor and fetal movement precautions reviewed. Follow up 1 week.

## 2019-12-08 LAB — CULTURE, BETA STREP (GROUP B ONLY): Strep Gp B Culture: NEGATIVE

## 2019-12-08 LAB — CERVICOVAGINAL ANCILLARY ONLY
Chlamydia: NEGATIVE
Comment: NEGATIVE
Comment: NORMAL
Neisseria Gonorrhea: NEGATIVE

## 2019-12-10 ENCOUNTER — Other Ambulatory Visit: Payer: Self-pay

## 2019-12-10 ENCOUNTER — Ambulatory Visit (INDEPENDENT_AMBULATORY_CARE_PROVIDER_SITE_OTHER): Payer: Medicaid Other | Admitting: Family Medicine

## 2019-12-10 VITALS — BP 94/62 | HR 74 | Wt 156.8 lb

## 2019-12-10 DIAGNOSIS — O99013 Anemia complicating pregnancy, third trimester: Secondary | ICD-10-CM

## 2019-12-10 DIAGNOSIS — Z23 Encounter for immunization: Secondary | ICD-10-CM

## 2019-12-10 DIAGNOSIS — R300 Dysuria: Secondary | ICD-10-CM

## 2019-12-10 DIAGNOSIS — B009 Herpesviral infection, unspecified: Secondary | ICD-10-CM | POA: Diagnosis not present

## 2019-12-10 DIAGNOSIS — Z3493 Encounter for supervision of normal pregnancy, unspecified, third trimester: Secondary | ICD-10-CM

## 2019-12-10 LAB — POCT URINALYSIS DIP (MANUAL ENTRY)
Bilirubin, UA: NEGATIVE
Blood, UA: NEGATIVE
Glucose, UA: NEGATIVE mg/dL
Ketones, POC UA: NEGATIVE mg/dL
Nitrite, UA: NEGATIVE
Protein Ur, POC: NEGATIVE mg/dL
Spec Grav, UA: 1.015 (ref 1.010–1.025)
Urobilinogen, UA: 1 E.U./dL
pH, UA: 7 (ref 5.0–8.0)

## 2019-12-10 LAB — POCT UA - MICROSCOPIC ONLY

## 2019-12-10 MED ORDER — CEPHALEXIN 500 MG PO CAPS: 500.0000 mg | ORAL_CAPSULE | Freq: Two times a day (BID) | ORAL | 0 refills | Status: AC

## 2019-12-10 NOTE — Assessment & Plan Note (Signed)
-  Continue valtrex BID

## 2019-12-10 NOTE — Assessment & Plan Note (Signed)
Continue iron every other day.

## 2019-12-10 NOTE — Patient Instructions (Addendum)
Today your were seen for a routine prenatal visit.   Continue to take you prenatal vitamin, iron pill, and valtrex.  You are experiencing some discomfort when you urinate. We will treat you with antibiotics for a UTI. You will take Keflex 500 mg twice daily for 7 days.   Please call the clinic at [phone] if your symptoms worsen or you have any concerns. It was our pleasure to serve you.  Follow up in 1 week.    Third Trimester of Pregnancy  The third trimester is from week 28 through week 40 (months 7 through 9). This trimester is when your unborn baby (fetus) is growing very fast. At the end of the ninth month, the unborn baby is about 20 inches in length. It weighs about 6-10 pounds. Follow these instructions at home: Medicines  Take over-the-counter and prescription medicines only as told by your doctor. Some medicines are safe and some medicines are not safe during pregnancy.  Take a prenatal vitamin that contains at least 600 micrograms (mcg) of folic acid.  If you have trouble pooping (constipation), take medicine that will make your stool soft (stool softener) if your doctor approves. Eating and drinking   Eat regular, healthy meals.  Avoid raw meat and uncooked cheese.  If you get low calcium from the food you eat, talk to your doctor about taking a daily calcium supplement.  Eat four or five small meals rather than three large meals a day.  Avoid foods that are high in fat and sugars, such as fried and sweet foods.  To prevent constipation: ? Eat foods that are high in fiber, like fresh fruits and vegetables, whole grains, and beans. ? Drink enough fluids to keep your pee (urine) clear or pale yellow. Activity  Exercise only as told by your doctor. Stop exercising if you start to have cramps.  Avoid heavy lifting, wear low heels, and sit up straight.  Do not exercise if it is too hot, too humid, or if you are in a place of great height (high  altitude).  You may continue to have sex unless your doctor tells you not to. Relieving pain and discomfort  Wear a good support bra if your breasts are tender.  Take frequent breaks and rest with your legs raised if you have leg cramps or low back pain.  Take warm water baths (sitz baths) to soothe pain or discomfort caused by hemorrhoids. Use hemorrhoid cream if your doctor approves.  If you develop puffy, bulging veins (varicose veins) in your legs: ? Wear support hose or compression stockings as told by your doctor. ? Raise (elevate) your feet for 15 minutes, 3-4 times a day. ? Limit salt in your food. Safety  Wear your seat belt when driving.  Make a list of emergency phone numbers, including numbers for family, friends, the hospital, and police and fire departments. Preparing for your baby's arrival To prepare for the arrival of your baby:  Take prenatal classes.  Practice driving to the hospital.  Visit the hospital and tour the maternity area.  Talk to your work about taking leave once the baby comes.  Pack your hospital bag.  Prepare the baby's room.  Go to your doctor visits.  Buy a rear-facing car seat. Learn how to install it in your car. General instructions  Do not use hot tubs, steam rooms, or saunas.  Do not use any products that contain nicotine or tobacco, such as cigarettes and e-cigarettes. If you need help  quitting, ask your doctor.  Do not drink alcohol.  Do not douche or use tampons or scented sanitary pads.  Do not cross your legs for long periods of time.  Do not travel for long distances unless you must. Only do so if your doctor says it is okay.  Visit your dentist if you have not gone during your pregnancy. Use a soft toothbrush to brush your teeth. Be gentle when you floss.  Avoid cat litter boxes and soil used by cats. These carry germs that can cause birth defects in the baby and can cause a loss of your baby (miscarriage) or  stillbirth.  Keep all your prenatal visits as told by your doctor. This is important. Contact a doctor if:  You are not sure if you are in labor or if your water has broken.  You are dizzy.  You have mild cramps or pressure in your lower belly.  You have a nagging pain in your belly area.  You continue to feel sick to your stomach, you throw up, or you have watery poop.  You have bad smelling fluid coming from your vagina.  You have pain when you pee. Get help right away if:  You have a fever.  You are leaking fluid from your vagina.  You are spotting or bleeding from your vagina.  You have severe belly cramps or pain.  You lose or gain weight quickly.  You have trouble catching your breath and have chest pain.  You notice sudden or extreme puffiness (swelling) of your face, hands, ankles, feet, or legs.  You have not felt the baby move in over an hour.  You have severe headaches that do not go away with medicine.  You have trouble seeing.  You are leaking, or you are having a gush of fluid, from your vagina before you are 37 weeks.  You have regular belly spasms (contractions) before you are 37 weeks. Summary  The third trimester is from week 28 through week 40 (months 7 through 9). This time is when your unborn baby is growing very fast.  Follow your doctor's advice about medicine, food, and activity.  Get ready for the arrival of your baby by taking prenatal classes, getting all the baby items ready, preparing the baby's room, and visiting your doctor to be checked.  Get help right away if you are bleeding from your vagina, or you have chest pain and trouble catching your breath, or if you have not felt your baby move in over an hour. This information is not intended to replace advice given to you by your health care provider. Make sure you discuss any questions you have with your health care provider. Document Revised: 07/10/2018 Document Reviewed:  04/24/2016 Elsevier Patient Education  2020 [employer].

## 2019-12-10 NOTE — Progress Notes (Addendum)
  [facility] Prenatal Visit  Robin Cordova is a 29 y.o. G3P2002 at [redacted]w[redacted]d here for routine follow up. She is dated by LMP.  She reports no complaints. She reports fetal movement. She denies vaginal bleeding, contractions, or loss of fluid. See flow sheet for details.  Vitals:   12/10/19 1125  BP: 94/62  Pulse: 74    A/P: Pregnancy at [redacted]w[redacted]d.  Doing well.   1. Routine prenatal care:  Marland Kitchen Dating reviewed, dating tab is correct . Fetal heart tones Appropriate . Fundal height within expected range.  . Fetal position confirmed Vertex using Leopold's .  Marland Kitchen Infant feeding choice: Both  . Contraception choice: Depo-Provera . Infant circumcision desired yes . Pain control in labor discussed and patient desires a natural birth.  . Influenza vaccine administered today.   . Tdap previously administered between 27-36 weeks  . GBS and gc/chlamydia testing results were reviewed today.   . Pregnancy education regarding labor, fetal movement,  benefits of breastfeeding, contraception, and safe infant sleep were discussed.  . Labor and fetal movement precautions reviewed. . Induction of labor discussed. Scheduled for induction at approximately 41 weeks. BPP scheduled between 40-41 weeks.   2. Pregnancy issues include the following and were addressed as appropriate today:    Anemia:  -Continue iron supplement every other day.   Dysuria -UA w/ trace leukcoytes -Cx pending -Keflex 500 mg BID x7 days   HSV -Continue valtrex  . Problem list and pregnancy box updated: Yes.   Follow up 1 week.   *Margaretmary Bayley Swahili in person interpreter was used during this entire. encounter.

## 2019-12-12 LAB — CULTURE, OB URINE

## 2019-12-12 LAB — URINE CULTURE, OB REFLEX

## 2019-12-18 ENCOUNTER — Ambulatory Visit (INDEPENDENT_AMBULATORY_CARE_PROVIDER_SITE_OTHER): Payer: Medicaid Other | Admitting: Family Medicine

## 2019-12-18 ENCOUNTER — Other Ambulatory Visit: Payer: Self-pay

## 2019-12-18 VITALS — BP 102/56 | HR 84 | Wt 161.1 lb

## 2019-12-18 DIAGNOSIS — Z3493 Encounter for supervision of normal pregnancy, unspecified, third trimester: Secondary | ICD-10-CM

## 2019-12-18 NOTE — Progress Notes (Signed)
°  [facility] Prenatal Visit  Robin Cordova is a 29 y.o. G3P2002 at [redacted]w[redacted]d here for routine follow up. She is dated by LMP.  She reports no bleeding, no contractions, no cramping and no leaking. She reports fetal movement. She denies vaginal bleeding, contractions, or loss of fluid. See flow sheet for details.  Vitals:   12/18/19 1404  BP: (!) 102/56  Pulse: 84    A/P: Pregnancy at [redacted]w[redacted]d.  Doing well.   1. Routine prenatal care:   Dating reviewed, dating tab is correct  Fetal heart tones Appropriate  Fundal height within expected range.   Fetal position confirmed Vertex using Ultrasound .   Infant feeding choice: Breastfeeding  Contraception choice: Depo-Provera  Infant circumcision desired yes  Pain control in labor discussed and patient desires an unmedicated birth and was informed of other options available in the hospital.   Influenza vaccine previously administered.    Tdap previously administered between 27-36 weeks   GBS and gc/chlamydia testing results were reviewed today.    Labor and fetal movement precautions reviewed.  Induction of labor discussed. Scheduled for induction at approximately 41 weeks. BPP scheduled between 40-41 weeks.   2. Pregnancy issues include the following and were addressed as appropriate today:   Seropositive for HSV: On Valtrex since 36 weeks.         Anemia: Continue prenatals with iron         History of COVID-19 infection: She has already had 2 ultrasounds performed per protocol.  No abnormalities.  No further intervention at this time.         Approaching late term: Scheduled for induction at 41 weeks.  Also scheduled for a BPP in her 40th week.   Problem list and pregnancy box updated: Yes.   Follow up 1 week.

## 2019-12-18 NOTE — Patient Instructions (Addendum)
It was so good to see you today.  I am glad your pregnancy has been going well so far.  We did a couple of things today to get ready for your delivery.  If you do not deliver by 41 weeks, we will admit you to the hospital and give you medicine to help you deliver a baby.  If you are still pregnant, you should get a call around the 27th or 28st of this month and they will tell you when to arrive at the hospital for your induction.  You have another ultrasound scheduled for 7:30 on 9/20.   I have also scheduled you for a follow-up visit in 1 week.  Ilikuwa nzuri sana [place] leo. Nafurahi ujauzito wako umekuwa ukiendelea vizuri hadi sasa. Tulifanya vitu [place] leo [place] kwa [place]. Ikiwa hautajifungua kabla ya wiki 41, tutakukubali hospitalini na [place] dawa ya kukusaidia kujifungua mtoto. Ikiwa bado una mjamzito, unapaswa kupata simu karibu na tarehe 27 au 28 ya mwezi huu na watakuambia ni lini unapaswa [place] hospitalini kwa [place].  Una ultrasound nyingine iliyopangwa kwa 7:30 mnamo 9/20.  Pia nimekupangia ziara ya ufuatiliaji [employer] 1.

## 2019-12-21 ENCOUNTER — Ambulatory Visit: Payer: Medicaid Other | Attending: Obstetrics and Gynecology

## 2019-12-21 ENCOUNTER — Other Ambulatory Visit: Payer: Self-pay

## 2019-12-21 DIAGNOSIS — Z3493 Encounter for supervision of normal pregnancy, unspecified, third trimester: Secondary | ICD-10-CM

## 2019-12-21 DIAGNOSIS — Z3A39 39 weeks gestation of pregnancy: Secondary | ICD-10-CM

## 2019-12-21 DIAGNOSIS — O2693 Pregnancy related conditions, unspecified, third trimester: Secondary | ICD-10-CM | POA: Diagnosis not present

## 2019-12-21 DIAGNOSIS — Z363 Encounter for antenatal screening for malformations: Secondary | ICD-10-CM

## 2019-12-21 DIAGNOSIS — O99013 Anemia complicating pregnancy, third trimester: Secondary | ICD-10-CM

## 2019-12-22 ENCOUNTER — Other Ambulatory Visit: Payer: Self-pay | Admitting: Family Medicine

## 2019-12-22 ENCOUNTER — Encounter (HOSPITAL_COMMUNITY): Payer: Medicaid Other

## 2019-12-22 ENCOUNTER — Telehealth (HOSPITAL_COMMUNITY): Payer: Self-pay | Admitting: *Deleted

## 2019-12-22 ENCOUNTER — Encounter (HOSPITAL_COMMUNITY): Payer: Self-pay | Admitting: *Deleted

## 2019-12-22 NOTE — Telephone Encounter (Signed)
Preadmission screen Interpreter number [id number] 

## 2019-12-23 ENCOUNTER — Encounter (HOSPITAL_COMMUNITY): Payer: Self-pay | Admitting: Obstetrics and Gynecology

## 2019-12-23 ENCOUNTER — Other Ambulatory Visit: Payer: Self-pay | Admitting: Advanced Practice Midwife

## 2019-12-23 ENCOUNTER — Inpatient Hospital Stay (HOSPITAL_COMMUNITY)
Admission: EM | Admit: 2019-12-23 | Discharge: 2019-12-25 | DRG: 806 | Disposition: A | Discharge status: Home / Self Care | Payer: Medicaid Other | Attending: Obstetrics and Gynecology | Admitting: Obstetrics and Gynecology

## 2019-12-23 DIAGNOSIS — D509 Iron deficiency anemia, unspecified: Secondary | ICD-10-CM | POA: Diagnosis present

## 2019-12-23 DIAGNOSIS — R1084 Generalized abdominal pain: Secondary | ICD-10-CM | POA: Diagnosis not present

## 2019-12-23 DIAGNOSIS — O479 False labor, unspecified: Secondary | ICD-10-CM | POA: Diagnosis not present

## 2019-12-23 DIAGNOSIS — O26893 Other specified pregnancy related conditions, third trimester: Secondary | ICD-10-CM | POA: Diagnosis not present

## 2019-12-23 DIAGNOSIS — Z3A40 40 weeks gestation of pregnancy: Secondary | ICD-10-CM | POA: Diagnosis not present

## 2019-12-23 DIAGNOSIS — O480 Post-term pregnancy: Secondary | ICD-10-CM | POA: Diagnosis not present

## 2019-12-23 DIAGNOSIS — A6000 Herpesviral infection of urogenital system, unspecified: Secondary | ICD-10-CM | POA: Diagnosis not present

## 2019-12-23 DIAGNOSIS — O9902 Anemia complicating childbirth: Secondary | ICD-10-CM | POA: Diagnosis not present

## 2019-12-23 DIAGNOSIS — R52 Pain, unspecified: Secondary | ICD-10-CM | POA: Diagnosis not present

## 2019-12-23 DIAGNOSIS — O9832 Other infections with a predominantly sexual mode of transmission complicating childbirth: Secondary | ICD-10-CM | POA: Diagnosis present

## 2019-12-23 DIAGNOSIS — Z8616 Personal history of COVID-19: Secondary | ICD-10-CM | POA: Diagnosis not present

## 2019-12-23 DIAGNOSIS — Z20822 Contact with and (suspected) exposure to covid-19: Secondary | ICD-10-CM | POA: Diagnosis present

## 2019-12-23 DIAGNOSIS — O43123 Velamentous insertion of umbilical cord, third trimester: Secondary | ICD-10-CM | POA: Diagnosis not present

## 2019-12-23 DIAGNOSIS — I959 Hypotension, unspecified: Secondary | ICD-10-CM | POA: Diagnosis not present

## 2019-12-23 DIAGNOSIS — Z743 Need for continuous supervision: Secondary | ICD-10-CM | POA: Diagnosis not present

## 2019-12-23 LAB — CBC
HCT: 30.2 % — ABNORMAL LOW (ref 36.0–46.0)
Hemoglobin: 9.2 g/dL — ABNORMAL LOW (ref 12.0–15.0)
MCH: 23.2 pg — ABNORMAL LOW (ref 26.0–34.0)
MCHC: 30.5 g/dL (ref 30.0–36.0)
MCV: 76.1 fL — ABNORMAL LOW (ref 80.0–100.0)
Platelets: 273 10*3/uL (ref 150–400)
RBC: 3.97 MIL/uL (ref 3.87–5.11)
RDW: 16.8 % — ABNORMAL HIGH (ref 11.5–15.5)
WBC: 9.6 10*3/uL (ref 4.0–10.5)
nRBC: 0.2 % (ref 0.0–0.2)

## 2019-12-23 LAB — TYPE AND SCREEN
ABO/RH(D): O POS
Antibody Screen: NEGATIVE

## 2019-12-23 LAB — SARS CORONAVIRUS 2 BY RT PCR (HOSPITAL ORDER, PERFORMED IN ~~LOC~~ HOSPITAL LAB): SARS Coronavirus 2: NEGATIVE

## 2019-12-23 MED ORDER — SIMETHICONE 80 MG PO CHEW: 80.0000 mg | CHEWABLE_TABLET | ORAL | Status: DC | PRN

## 2019-12-23 MED ORDER — ZOLPIDEM TARTRATE 5 MG PO TABS: 5.0000 mg | ORAL_TABLET | Freq: Every evening | ORAL | Status: DC | PRN

## 2019-12-23 MED ORDER — OXYTOCIN-SODIUM CHLORIDE 30-0.9 UT/500ML-% IV SOLN: 2.5000 [IU]/h | INTRAVENOUS | Status: DC

## 2019-12-23 MED ORDER — PRENATAL MULTIVITAMIN CH
1.0000 | ORAL_TABLET | Freq: Every day | ORAL | Status: DC
  Administered 2019-12-24 – 2019-12-25 (×2): 1 via ORAL
  Filled 2019-12-23 (×2): qty 1

## 2019-12-23 MED ORDER — DIBUCAINE (PERIANAL) 1 % EX OINT: 1.0000 "application " | TOPICAL_OINTMENT | CUTANEOUS | Status: DC | PRN

## 2019-12-23 MED ORDER — OXYTOCIN BOLUS FROM INFUSION: 333.0000 mL | Freq: Once | INTRAVENOUS | Status: AC

## 2019-12-23 MED ORDER — OXYCODONE-ACETAMINOPHEN 5-325 MG PO TABS: 2.0000 | ORAL_TABLET | ORAL | Status: DC | PRN

## 2019-12-23 MED ORDER — BENZOCAINE-MENTHOL 20-0.5 % EX AERO: 1.0000 "application " | INHALATION_SPRAY | CUTANEOUS | Status: DC | PRN

## 2019-12-23 MED ORDER — OXYTOCIN-SODIUM CHLORIDE 30-0.9 UT/500ML-% IV SOLN
INTRAVENOUS | Status: AC
  Administered 2019-12-23: 333 mL via INTRAVENOUS
  Filled 2019-12-23: qty 500

## 2019-12-23 MED ORDER — ONDANSETRON HCL 4 MG PO TABS: 4.0000 mg | ORAL_TABLET | ORAL | Status: DC | PRN

## 2019-12-23 MED ORDER — ACETAMINOPHEN 325 MG PO TABS: 650.0000 mg | ORAL_TABLET | ORAL | Status: DC | PRN

## 2019-12-23 MED ORDER — ONDANSETRON HCL 4 MG/2ML IJ SOLN: 4.0000 mg | INTRAMUSCULAR | Status: DC | PRN

## 2019-12-23 MED ORDER — ONDANSETRON HCL 4 MG/2ML IJ SOLN: 4.0000 mg | Freq: Four times a day (QID) | INTRAMUSCULAR | Status: DC | PRN

## 2019-12-23 MED ORDER — LACTATED RINGERS IV SOLN: INTRAVENOUS | Status: DC

## 2019-12-23 MED ORDER — ACETAMINOPHEN 325 MG PO TABS: 650.0000 mg | ORAL_TABLET | Freq: Four times a day (QID) | ORAL | Status: DC | PRN

## 2019-12-23 MED ORDER — LIDOCAINE HCL (PF) 1 % IJ SOLN
30.0000 mL | INTRAMUSCULAR | Status: AC | PRN
  Administered 2019-12-23: 30 mL via SUBCUTANEOUS
  Filled 2019-12-23: qty 30

## 2019-12-23 MED ORDER — COCONUT OIL OIL
1.0000 "application " | TOPICAL_OIL | Status: DC | PRN
  Administered 2019-12-25: 1 via TOPICAL

## 2019-12-23 MED ORDER — SENNOSIDES-DOCUSATE SODIUM 8.6-50 MG PO TABS
2.0000 | ORAL_TABLET | ORAL | Status: DC
  Administered 2019-12-23 – 2019-12-24 (×2): 2 via ORAL
  Filled 2019-12-23 (×2): qty 2

## 2019-12-23 MED ORDER — IBUPROFEN 800 MG PO TABS
800.0000 mg | ORAL_TABLET | Freq: Three times a day (TID) | ORAL | Status: DC
  Administered 2019-12-23 – 2019-12-25 (×6): 800 mg via ORAL
  Filled 2019-12-23 (×6): qty 1

## 2019-12-23 MED ORDER — FENTANYL CITRATE (PF) 100 MCG/2ML IJ SOLN
INTRAMUSCULAR | Status: AC
Start: 2019-12-23 — End: 2019-12-24
  Filled 2019-12-23: qty 2

## 2019-12-23 MED ORDER — WITCH HAZEL-GLYCERIN EX PADS: 1.0000 "application " | MEDICATED_PAD | CUTANEOUS | Status: DC | PRN

## 2019-12-23 MED ORDER — SOD CITRATE-CITRIC ACID 500-334 MG/5ML PO SOLN: 30.0000 mL | ORAL | Status: DC | PRN

## 2019-12-23 MED ORDER — DIPHENHYDRAMINE HCL 25 MG PO CAPS: 25.0000 mg | ORAL_CAPSULE | Freq: Four times a day (QID) | ORAL | Status: DC | PRN

## 2019-12-23 MED ORDER — FENTANYL CITRATE (PF) 100 MCG/2ML IJ SOLN
50.0000 ug | Freq: Once | INTRAMUSCULAR | Status: AC
  Administered 2019-12-23: 50 ug via INTRAVENOUS

## 2019-12-23 MED ORDER — OXYCODONE-ACETAMINOPHEN 5-325 MG PO TABS: 1.0000 | ORAL_TABLET | ORAL | Status: DC | PRN

## 2019-12-23 MED ORDER — TETANUS-DIPHTH-ACELL PERTUSSIS 5-2.5-18.5 LF-MCG/0.5 IM SUSP: 0.5000 mL | Freq: Once | INTRAMUSCULAR | Status: DC

## 2019-12-23 MED ORDER — LACTATED RINGERS IV SOLN: 500.0000 mL | INTRAVENOUS | Status: DC | PRN

## 2019-12-23 NOTE — Discharge Summary (Addendum)
Postpartum Discharge Summary  12/25/19     Patient Name: Robin Cordova DOB: 29-Dec-1990 MRN: [id number]  Date of admission: 12/23/2019 Delivery date:12/23/2019  Delivering provider: Julianne Handler  Date of discharge: 12/25/2019  Admitting diagnosis: Indication for care in labor and delivery, antepartum [O75.9] Intrauterine pregnancy: [redacted]w[redacted]d    Secondary diagnosis:  Active Problems:   Indication for care in labor and delivery, antepartum   Vaginal delivery  Additional problems: language barrier, hx HSV, anemia, positive antibody screen    Discharge diagnosis: Term Pregnancy Delivered                                              Post partum procedures:None Augmentation: AROM Complications: None  Hospital course: Onset of Labor With Vaginal Delivery      29y.o. yo [id number] admitted in Active Labor on 12/23/2019. Patient had an uncomplicated labor course as follows:  Membrane Rupture Time/Date: 5:35 PM ,12/23/2019   Delivery Method:Vaginal, Spontaneous  Episiotomy: None  Lacerations:  2nd degree  Patient had an uncomplicated postpartum course.  She is ambulating, tolerating a regular diet, passing flatus, and urinating well. Patient is discharged home in stable condition on 12/25/19.  Newborn Data: Birth date:12/23/2019  Birth time:5:53 PM  Gender:Female  Living status:Living  Apgars:9 ,9  Weight:3570 g   Magnesium Sulfate received: No BMZ received: No Rhophylac:N/A MMR:N/A T-DaP:Given prenatally Flu: Yes Transfusion:No  Physical exam  Vitals:   12/24/19 1050 12/24/19 1420 12/24/19 2203 12/25/19 0538  BP: 95/66 122/68 108/64 104/63  Pulse: 82 84 (!) 59 63  Resp: 18 17 18 16   Temp: 98.6 F (37 C) 98.2 F (36.8 C) 98.5 F (36.9 C) 97.7 F (36.5 C)  TempSrc: Oral Oral Oral Oral  SpO2: 100% 98%     General: alert, cooperative and no distress Lochia: appropriate Uterine Fundus: firm Incision: N/A DVT Evaluation: No evidence of DVT seen on physical  exam. No cords or calf tenderness. Labs: Lab Results  Component Value Date   WBC 11.9 (H) 12/24/2019   HGB 7.9 (L) 12/24/2019   HCT 26.4 (L) 12/24/2019   MCV 75.4 (L) 12/24/2019   PLT 247 12/24/2019   CMP Latest Ref Rng & Units 11/20/2019  Glucose 65 - 99 mg/dL 88  BUN 6 - 20 mg/dL 5(L)  Creatinine 0.57 - 1.00 mg/dL 0.32(L)  Sodium 134 - 144 mmol/L 132(L)  Potassium 3.5 - 5.2 mmol/L 3.3(L)  Chloride 96 - 106 mmol/L 105  CO2 20 - 29 mmol/L 16(L)  Calcium 8.7 - 10.2 mg/dL 8.9  Total Protein 6.0 - 8.5 g/dL 6.0  Total Bilirubin 0.0 - 1.2 mg/dL <0.2  Alkaline Phos 48 - 121 IU/L 183(H)  AST 0 - 40 IU/L 14  ALT 0 - 32 IU/L 7   Edinburgh Score: Edinburgh Postnatal Depression Scale Screening Tool 12/23/2019  I have been able to laugh and see the funny side of things. 0  I have looked forward with enjoyment to things. 0  I have blamed myself unnecessarily when things went wrong. 0  I have been anxious or worried for no good reason. 0  I have felt scared or panicky for no good reason. 0  Things have been getting on top of me. 0  I have been so unhappy that I have had difficulty sleeping. 0  I have felt sad or  miserable. 0  I have been so unhappy that I have been crying. 0  The thought of harming myself has occurred to me. 0  Edinburgh Postnatal Depression Scale Total 0     After visit meds:  Allergies as of 12/25/2019   No Known Allergies     Medication List    TAKE these medications   acetaminophen 325 MG tablet Commonly known as: Tylenol Take 2 tablets (650 mg total) by mouth every 6 (six) hours as needed for moderate pain or fever (for pain scale < 4).   coconut oil Oil Apply 1 application topically as needed.   ferrous sulfate 324 (65 Fe) MG Tbec Take 1 tablet (324 mg total) by mouth every other day.   hydrocortisone 1 % ointment Apply 1 application topically 2 (two) times daily.   ibuprofen 800 MG tablet Commonly known as: ADVIL Take 1 tablet (800 mg total) by  mouth every 8 (eight) hours.   prenatal multivitamin Tabs tablet Take 1 tablet by mouth daily at 12 noon.   valACYclovir 500 MG tablet Commonly known as: Valtrex Take 1 tablet (500 mg total) by mouth 2 (two) times daily.        Discharge home in stable condition Infant Feeding: Bottle and Breast Infant Disposition:home with mother Discharge instruction: per After Visit Summary and Postpartum booklet. Activity: Advance as tolerated. Pelvic rest for 6 weeks.  Diet: routine diet Future Appointments: Future Appointments  Date Time Provider [place]  12/28/2019 10:15 AM [facility] None   Follow up Visit:   Please schedule this patient for a In person postpartum visit in 4 weeks with the following provider: Any provider. Additional Postpartum F/U:none  Low risk pregnancy complicated by: none Delivery mode:  Vaginal, Spontaneous  Anticipated Birth Control:  Unsure   12/25/2019 Sharion Settler, DO

## 2019-12-23 NOTE — MAU Note (Addendum)
Pt arrived by EMS(IV/saline lock in left [facility], started by EMS), appears very uncomfortable.  Report rec'd G3P2 at 40.1wks. pain x2 hrs.  Dr Salomon Mast present on arrival.   Pt checked.- labor team called and notified.  L&D charge called, rm assigned.

## 2019-12-23 NOTE — H&P (Addendum)
LABOR AND DELIVERY ADMISSION HISTORY AND PHYSICAL NOTE  Robin Cordova is a 29 y.o. female G5P3003 with IUP at [redacted]w[redacted]d by [redacted]w[redacted]d [name] presenting for active labor.   She reports positive fetal movement. She reports strong contractions, denies LOF.  She plans on breast and bottle feeding. Her contraception plan is: Depo.  Prenatal History/Complications: PNC at [facility]:  @[redacted]w[redacted]d , normal anatomy, cephalic presentation, posterior placenta, 44%ile, EFW 3002.  Pregnancy complications:  - anemia - genital HSV (taking Valtrex) - Covid positive 10/13/19  Past Medical History: Past Medical History:  Diagnosis Date  . Anemia    Past Surgical History: Past Surgical History:  Procedure Laterality Date  . NO PAST SURGERIES     Obstetrical History: OB History    Gravida  3   Para  3   Term  3   Preterm  0   AB  0   Living  3     SAB      TAB      Ectopic      Multiple  0   Live Births  3          Social History: Social History   Socioeconomic History  . Marital status: Single    Spouse name: Not on file  . Number of children: Not on file  . Years of education: Not on file  . Highest education level: Not on file  Occupational History  . Not on file  Tobacco Use  . Smoking status: Never Smoker  . Smokeless tobacco: Never Used  Substance and Sexual Activity  . Alcohol use: Never  . Drug use: Never  . Sexual activity: Yes  Other Topics Concern  . Not on file  Social History Narrative  . Not on file   Social Determinants of Health   Financial Resource Strain:   . Difficulty of Paying Living Expenses: Not on file  Food Insecurity:   . Worried About 10/15/19 in the Last Year: Not on file  . Ran Out of Food in the Last Year: Not on file  Transportation Needs:   . Lack of Transportation (Medical): Not on file  . Lack of Transportation (Non-Medical): Not on file  Physical Activity:   . Days of Exercise per Week: Not on file  . Minutes of Exercise  per Session: Not on file  Stress:   . Feeling of Stress : Not on file  Social Connections:   . Frequency of Communication with Friends and Family: Not on file  . Frequency of Social Gatherings with Friends and Family: Not on file  . Attends Religious Services: Not on file  . Active Member of Clubs or Organizations: Not on file  . Attends Programme researcher, broadcasting/film/video Meetings: Not on file  . Marital Status: Not on file   Family History: Family History  Problem Relation Age of Onset  . Healthy Mother   . Healthy Father   . Healthy Daughter   . Healthy Son   . Cancer Neg Hx   . Stroke Neg Hx    Allergies: No Known Allergies  Medications Prior to Admission  Medication Sig Dispense Refill Last Dose  . ferrous sulfate 324 (65 Fe) MG TBEC Take 1 tablet (324 mg total) by mouth every other day. 30 tablet 1   . hydrocortisone 1 % ointment Apply 1 application topically 2 (two) times daily. 110 g 3   . Prenatal Vit-Fe Fumarate-FA (PRENATAL MULTIVITAMIN) TABS tablet Take 1 tablet by mouth daily  at 12 noon. 90 tablet 1   . valACYclovir (VALTREX) 500 MG tablet Take 1 tablet (500 mg total) by mouth 2 (two) times daily. 60 tablet 1    Review of Systems  All systems reviewed and negative except as stated in HPI  Physical Exam Blood pressure 117/63, pulse 89, temperature 98.7 F (37.1 C), temperature source Oral, resp. rate 16, last menstrual period 03/17/2019, unknown if currently breastfeeding. General appearance: alert, oriented Lungs: normal respiratory effort Abdomen: soft, non-tender; gravid Presentation: cephalic  Fetal monitoring: Baseline: 130 bpm, Variability: mod, Accelerations: Reactive and Decelerations: Absent Uterine activity: Duration: q 2 mins  Dilation: 10 Effacement (%): 90 Station: Crowning Exam by:: sparacino  Prenatal labs: ABO, Rh: --/--/O POS (09/22 1909) Antibody: NEG (09/22 1909) Rubella: >33.00 (05/03 1401) RPR: Non Reactive (06/25 1255)  HBsAg: Negative (05/03  1401)  HIV: Non Reactive (06/25 1255)  GC/Chlamydia: negative GBS: Negative/-- (09/03 1410)  2-hr GTT: wnl Genetic screening: declined Anatomy US: normal  Prenatal Transfer Tool  Maternal Diabetes: No Genetic Screening: Declined Maternal Ultrasounds/Referrals: Normal Fetal Ultrasounds or other Referrals:  None Maternal Substance Abuse:  No Significant Maternal Medications:  Meds include: Other:  Valtrex Significant Maternal Lab Results: Group B Strep negative, COVID positive on 7/13  Results for orders placed or performed during the hospital encounter of 12/23/19 (from the past 24 hour(s))  SARS Coronavirus 2 by RT PCR (hospital order, performed in [facility] hospital lab) Nasopharyngeal Nasopharyngeal Swab   Collection Time: 12/23/19  6:14 PM   Specimen: Nasopharyngeal Swab  Result Value Ref Range   SARS Coronavirus 2 NEGATIVE NEGATIVE  CBC   Collection Time: 12/23/19  7:09 PM  Result Value Ref Range   WBC 9.6 4.0 - 10.5 K/uL   RBC 3.97 3.87 - 5.11 MIL/uL   Hemoglobin 9.2 (L) 12.0 - 15.0 g/dL   HCT 56.8 (L) 36 - 46 %   MCV 76.1 (L) 80.0 - 100.0 fL   MCH 23.2 (L) 26.0 - 34.0 pg   MCHC 30.5 30.0 - 36.0 g/dL   RDW 12.7 (H) 51.7 - 00.1 %   Platelets 273 150 - 400 K/uL   nRBC 0.2 0.0 - 0.2 %  Type and screen [facility]   Collection Time: 12/23/19  7:09 PM  Result Value Ref Range   ABO/RH(D) O POS    Antibody Screen NEG    Sample Expiration      12/26/2019,2359 Performed at [facility], [address], [place], [place] [address]     Patient Active Problem List   Diagnosis Date Noted  . Indication for care in labor and delivery, antepartum 12/23/2019  . Vaginal delivery 12/23/2019  . Anemia affecting pregnancy 10/01/2019  . History of episiotomy 08/26/2019  . LGSIL on Pap smear of cervix 08/19/2019  . Herpes simplex virus infection 08/19/2019  . Positive antibody screen  08/19/2019  . Supervision of low-risk pregnancy 08/03/2019     Assessment: Robin Cordova is a 29 y.o. G3P3003 at [redacted]w[redacted]d here for labor management.  #Labor: Progressing normally  #Fetal Wellbeing:  Category I #Pain Control: Labor support without medications #GBS/ID: neg #Genital HSV: on Valtrex, no active lesions on exam #COVID: negative #Circ: desires #Anticipated MOD: NSVD #contraception: depo, but unsure, will counsel tomorrow #LSIL on pap smear: needs colpo postpartum  Herby Abraham MD, PGY-1 Family Medicine Resident, [facility] Faculty Teaching Service  12/23/2019, 9:07 PM   Midwife attestation: I have seen and examined this patient; I agree with above  documentation in the resident's note.   PE: Gen: calm comfortable, NAD Resp: normal effort and rate Abd: gravid  ROS, labs, PMH reviewed  Assessment/Plan: [redacted] weeks gestation Labor: active FWB: Cat I GBS: pos Admit to LD Anticipate precipitous delivery  Donette Larry, CNM  12/24/2019, 12:46 PM

## 2019-12-24 ENCOUNTER — Encounter: Payer: Medicaid Other | Admitting: Family Medicine

## 2019-12-24 ENCOUNTER — Other Ambulatory Visit: Payer: Self-pay | Admitting: Obstetrics and Gynecology

## 2019-12-24 DIAGNOSIS — Z3A40 40 weeks gestation of pregnancy: Secondary | ICD-10-CM | POA: Diagnosis not present

## 2019-12-24 DIAGNOSIS — O480 Post-term pregnancy: Secondary | ICD-10-CM | POA: Diagnosis not present

## 2019-12-24 DIAGNOSIS — O9902 Anemia complicating childbirth: Secondary | ICD-10-CM | POA: Diagnosis not present

## 2019-12-24 DIAGNOSIS — Z3403 Encounter for supervision of normal first pregnancy, third trimester: Secondary | ICD-10-CM

## 2019-12-24 LAB — CBC
HCT: 26.4 % — ABNORMAL LOW (ref 36.0–46.0)
Hemoglobin: 7.9 g/dL — ABNORMAL LOW (ref 12.0–15.0)
MCH: 22.6 pg — ABNORMAL LOW (ref 26.0–34.0)
MCHC: 29.9 g/dL — ABNORMAL LOW (ref 30.0–36.0)
MCV: 75.4 fL — ABNORMAL LOW (ref 80.0–100.0)
Platelets: 247 10*3/uL (ref 150–400)
RBC: 3.5 MIL/uL — ABNORMAL LOW (ref 3.87–5.11)
RDW: 16.9 % — ABNORMAL HIGH (ref 11.5–15.5)
WBC: 11.9 10*3/uL — ABNORMAL HIGH (ref 4.0–10.5)
nRBC: 0.3 % — ABNORMAL HIGH (ref 0.0–0.2)

## 2019-12-24 LAB — RPR: RPR Ser Ql: NONREACTIVE

## 2019-12-24 MED ORDER — MEDROXYPROGESTERONE ACETATE 150 MG/ML IM SUSP: 150.0000 mg | Freq: Once | INTRAMUSCULAR | Status: DC

## 2019-12-24 NOTE — Progress Notes (Signed)
POSTPARTUM PROGRESS NOTE  Subjective: Robin Cordova is a 28 y.o. [id number] s/p SVD at [redacted]w[redacted]d for precipitous onset of labor.  She reports she doing well. No acute events overnight. She denies any problems with ambulating, voiding or po intake. Denies nausea or vomiting. She has not passed flatus. Pain is well controlled.  Lochia is minimal, no clots.  Objective: Blood pressure 106/70, pulse 70, temperature 98.1 F (36.7 C), temperature source Oral, resp. rate 16, last menstrual period 03/17/2019, SpO2 99 %, currently breastfeeding.  Physical Exam:  General: alert, cooperative and no distress Chest: no respiratory distress Abdomen: soft, non-tender  Uterine Fundus: firm and at level of umbilicus Extremities: No calf swelling or tenderness  no edema  Recent Labs    12/23/19 1909 12/24/19 0524  HGB 9.2* 7.9*  HCT 30.2* 26.4*    Assessment/Plan: Robin Cordova is a 29 y.o. O2H4765 s/p SVD at [redacted]w[redacted]d for precipitous onset of labor. Patient has a history LSIL needing PP colpo and HSV on valtrex since 36 weeks.  Routine Postpartum Care: Doing well, pain well-controlled.  -- Continue routine care, lactation support, does NOT want a circumcision  -- Contraception: Depo? Needs more counseling on options  -- Feeding: Breast feeding and bottle  Anemia: Hgb decreased from 9.2 to 7.9, asymptomatic. Start PO iron.  Dispo: Plan for discharge tomorrow .  Ginger Organ, Student-PA 12/24/2019 8:16 AM

## 2019-12-24 NOTE — Progress Notes (Signed)
Offered to call dietary and order meals due to language barrier. Patient declined and stated her family was bringing her food from home. Only requested ice water and juice.

## 2019-12-24 NOTE — Progress Notes (Signed)
Orders for admission placed. IOL scheduled for 12/29/19.

## 2019-12-25 ENCOUNTER — Encounter (HOSPITAL_COMMUNITY): Payer: Self-pay | Admitting: Obstetrics and Gynecology

## 2019-12-25 ENCOUNTER — Ambulatory Visit: Payer: Self-pay

## 2019-12-25 DIAGNOSIS — R0682 Tachypnea, not elsewhere classified: Secondary | ICD-10-CM | POA: Diagnosis not present

## 2019-12-25 MED ORDER — COCONUT OIL OIL: 1.0000 "application " | TOPICAL_OIL | 0 refills | Status: AC | PRN

## 2019-12-25 MED ORDER — IBUPROFEN 800 MG PO TABS
800.0000 mg | ORAL_TABLET | Freq: Three times a day (TID) | ORAL | 0 refills | Status: DC
Start: 2019-12-25 — End: 2023-11-18

## 2019-12-25 MED ORDER — ACETAMINOPHEN 325 MG PO TABS: 650.0000 mg | ORAL_TABLET | Freq: Four times a day (QID) | ORAL | 0 refills | Status: AC | PRN

## 2019-12-25 NOTE — Lactation Note (Signed)
This note was copied from a baby's chart. Lactation Consultation Note  Patient Name: Robin Cordova [id number] Date: 12/25/2019  Infant is 40 weeks 43 hours old with a 3% weight loss. Infant experiencing TTN and will remain here until tomorrow as per Mom. LC able to communicate with Mom through help of a translator, Thurston Hole [phone], speaking Swahili.   Mom has hx of breastfeeding her other children for 11 months. Infant has adequate urine and stool output. Mom is both breast and bottle feeding because she is concerned her milk has not come in. Infant given Similac with Iron 28 and 38 ml at 5 am and 10 am feeding. RN reviewed with Mom these amounts were too large.   LC examined Mom's nipples and both were erect with no signs of abrasions, redness or trauma. Mom denies any pain with the latch and states nursing is going well.   LC assisted Mom latching the infant on the L and R breast in cradle with a total feeding time of 27 minutes. LC helped Mom to set up a DEBP and Mom fitted with a 27 flange collecting EBM 73ml but still pumping at the end of the visit.   Plan 1. Mom to feed based on cues 8-12x in 24 hour period offering both breasts, STS, providing stimulation if needed.           2. Mom to supplement infant with EBM or Similac with Iron based on breastfeeding supplementation guidelines based on hours after birth. Guidelines reviewed with Mom with assistance of interpreter.          3. Mom to pump using DEBP q 3hrs for 15 minutes.          4. Signs, symptoms, treatments and prevention of engorgement reviewed with           Mom.            5. Lactation inpatient and outpatient services reviewed with brochure.             6. Plan reviewed with RN.              7. WIC referral sent to get Mom DEBP for home

## 2019-12-26 ENCOUNTER — Ambulatory Visit: Payer: Self-pay

## 2019-12-26 DIAGNOSIS — R0682 Tachypnea, not elsewhere classified: Secondary | ICD-10-CM | POA: Diagnosis not present

## 2019-12-26 DIAGNOSIS — Q211 Atrial septal defect: Secondary | ICD-10-CM | POA: Diagnosis not present

## 2019-12-26 NOTE — Lactation Note (Signed)
This note was copied from a baby's chart. [name] Date: 12/26/2019 Reason for consult: Follow-up assessment  Consult done with [facility] [phone]  Follow up with 57 hours old infant of a P3 mother with breastfeeding experience. Mother is latching infant to right breast, side-lying position upon arrival. Mother states breastfeeding is going well and does not reports any problems at this point. Mother has not used any bottle formula ~18 hours.   Infant latched easily and noted flanged lips and rhythmic suckling. Appreciated swallowing and mother denies pain or discomfort. Encouraged ear, shoulder and hip alignment as well as skin to skin when feeding. Mother verbalized understanding.    Reviewed breastfeeding basics. Discussed milk coming to volume. Reviewed newborn behavior and expectations with mother. Signs of effective milk transfer. Encouraged to contact [facility] for support, questions or concerns.    All questions answered at this time. Mother is expecting to be discharged today.   Feeding Feeding Type: Breast Fed  LATCH Score Latch: Grasps breast easily, tongue down, lips flanged, rhythmical sucking.  Audible Swallowing: Spontaneous and intermittent  Type of Nipple: Everted at rest and after stimulation  Comfort (Breast/Nipple): Soft / non-tender  Hold (Positioning): Assistance needed to correctly position infant at breast and maintain latch.  LATCH Score: 9  Interventions Interventions: Breast feeding basics reviewed;Adjust position  Consult Status Consult Status: Complete Date: 12/26/19 Follow-up type: Call as needed    Marguis Mathieson A Higuera Ancidey 12/26/2019, 9:49 AM

## 2019-12-28 ENCOUNTER — Telehealth: Payer: Self-pay | Admitting: *Deleted

## 2019-12-28 ENCOUNTER — Other Ambulatory Visit (HOSPITAL_COMMUNITY): Payer: Medicaid Other

## 2019-12-28 NOTE — Telephone Encounter (Signed)
Transition Care Management Unsuccessful Follow-up Telephone Call  Date of discharge and from where:  12/25/2019 [facility]  Attempts:  1st Attempt  Reason for unsuccessful TCM follow-up call:  Unable to reach patient    Called was made with an interpreter service at [phone]

## 2019-12-29 ENCOUNTER — Inpatient Hospital Stay (HOSPITAL_COMMUNITY)
Admission: AD | Admit: 2019-12-29 | Payer: Medicaid Other | Source: Home / Self Care | Admitting: Obstetrics and Gynecology

## 2019-12-29 ENCOUNTER — Inpatient Hospital Stay (HOSPITAL_COMMUNITY): Payer: Medicaid Other

## 2019-12-29 NOTE — Telephone Encounter (Signed)
Transition Care Management Unsuccessful Follow-up Telephone Call  Date of discharge and from where:  12/25/2019  Attempts:  2nd Attempt  Reason for unsuccessful TCM follow-up call:  Unable to reach patient   Call was made with an interpreter service at [phone]

## 2019-12-30 NOTE — Telephone Encounter (Signed)
Transition Care Management Unsuccessful Follow-up Telephone Call  Date of discharge and from where:  12/25/19 [facility]  Attempts:  3rd Attempt  Reason for unsuccessful TCM follow-up call:  Unable to reach patient

## 2020-01-01 DIAGNOSIS — Z419 Encounter for procedure for purposes other than remedying health state, unspecified: Secondary | ICD-10-CM | POA: Diagnosis not present

## 2020-01-22 ENCOUNTER — Ambulatory Visit (INDEPENDENT_AMBULATORY_CARE_PROVIDER_SITE_OTHER): Payer: Medicaid Other | Admitting: Family Medicine

## 2020-01-22 ENCOUNTER — Other Ambulatory Visit: Payer: Self-pay

## 2020-01-22 ENCOUNTER — Encounter: Payer: Self-pay | Admitting: Family Medicine

## 2020-01-22 NOTE — Patient Instructions (Addendum)
I am glad you are recovering well from your delivery.  It is important that we follow-up on your last Pap smear.  Please schedule an appointment with our colposcopy clinic to come in for a procedure to look at your cervix.  Please schedule an appointment to come back for a Depo shot for to discuss other birth control options.  Ninafurahi kuwa unapona vizuri kutoka kwa [employer]. Ni muhimu tukafuatilia smear yako ya mwisho ya Pap. Tafadhali panga miadi na kliniki yetu ya colposcopy [place] kwa utaratibu wa kuangalia kizazi chako.  Tafadhali panga miadi ya kurudi kwa risasi ya Depo ili [place] chaguzi zingine [place].

## 2020-01-22 NOTE — Progress Notes (Signed)
  [facility] Postpartum Visit   Robin Cordova is a 29 y.o. [phone] presenting for a postpartum visit.  She has the following concerns today: no concerns about herself. She delivered via SVD at 12/23/2019.  She reports her vaginal bleeding is gone. She is breast feeding her infant. She feels she is bonding well. She is considering Depo for contraception.   PHQ-9 score of 0 today with a score of 0 for question 9.  She agrees with this assessment and denies any evidence of depressive symptoms at this time. Reviewed pregnancy and delivery course .  -  Vitals:   01/22/20 1044  BP: 100/62  Pulse: 64  SpO2: 99%   Exam:  General: Resting comfortably in the exam room.  No acute distress. HEENT: Moist mucous membranes. Respiratory: Breathing comfortably on room air.  No evidence of respiratory distress. Pelvic exam: Well-healed perineum with no evidence of laceration, erythema, purulence.  Moist, pink vaginal mucosa without evidence of discharge.  No tenderness with cervical motion or adnexal palpation.  A/P:  Postpartum visit: patient is 4 weeks postpartum following a SVD delivery. -Discussed patients delivery and complications -Patient had a second degree laceration, perineal healing reviewed. Patient expressed understanding -Patient has urinary incontinence? No  -Patient is safe to resume physical and sexual activity -Patient does want a pregnancy in the next year.  Desired family size is 5 children.  -Reviewed forms of contraception in tiered fashion. Patient desired no method today.  We discussed the importance of birth control and avoiding unintended pregnancy.  She reports that she plans to return to clinic for Depo injections once she resumes her normal menstrual cycle.  She was encouraged to consider starting birth control as soon as possible. -Return to sexual activity and contraception discussed as above.  -Discussed birth spacing of 18 months -Breastfeeding: Yes,  provided support of decision and resources as indicated  -Mood: great!  -Discussed sleep and fatigue management and encouraged family/community support.  -Postpartum vaccines: none -Need for postpartum diabetes screening:  no  -Reviewed prior Pap and scheduled for a colposcopy.  -Return in No follow-ups on file.

## 2020-01-28 ENCOUNTER — Ambulatory Visit (INDEPENDENT_AMBULATORY_CARE_PROVIDER_SITE_OTHER): Payer: Medicaid Other | Admitting: Family Medicine

## 2020-01-28 ENCOUNTER — Other Ambulatory Visit: Payer: Self-pay

## 2020-01-28 VITALS — BP 106/64 | HR 64 | Wt 151.0 lb

## 2020-01-28 DIAGNOSIS — R87612 Low grade squamous intraepithelial lesion on cytologic smear of cervix (LGSIL): Secondary | ICD-10-CM | POA: Diagnosis not present

## 2020-01-28 DIAGNOSIS — R059 Cough, unspecified: Secondary | ICD-10-CM | POA: Diagnosis not present

## 2020-01-28 LAB — POCT URINE PREGNANCY: Preg Test, Ur: NEGATIVE

## 2020-01-28 NOTE — Progress Notes (Signed)
Video interpretor used for entire visit  LGSIL on pap at prenatal visit No history of prior abnormal paps Patient given informed consent, signed copy in the chart.  Placed in lithotomy position. Cervix viewed with speculum and colposcope after application of acetic acid.   Colposcopy adequate (entire squamocolumnar junctions seen  in entirety) ?  yes Acetowhite lesions? no Punctation? no Mosaicism?  no Abnormal vasculature?  no Biopsies? no ECC? no Complications? None; she may have some minor vaginal bleeding/spotting from placement of speculum  COMMENTS:  Patient was given post procedure instructions.  Clinically normal colposcopy Recommend f/u pap in one year

## 2020-01-28 NOTE — Patient Instructions (Signed)
We recommend having a pap smear in 1 year, October 2022.   Colposcopy, Care After This sheet gives you information about how to care for yourself after your procedure. Your doctor may also give you more specific instructions. If you have problems or questions, contact your doctor. What can I expect after the procedure? If you did not have a tissue sample removed (did not have a biopsy), you may only have some spotting for a few days. You can go back to your normal activities. If you had a tissue sample removed, it is common to have:  Soreness and pain. This may last for a few days.  Light-headedness.  Mild bleeding from your vagina or dark-colored, grainy discharge from your vagina. This may last for a few days. You may need to wear a sanitary pad.  Spotting for at least 48 hours after the procedure. Follow these instructions at home:   Take over-the-counter and prescription medicines only as told by your doctor. Ask your doctor what medicines you can start taking again. This is very important if you take blood-thinning medicine.  Do not drive or use heavy machinery while taking prescription pain medicine.  For 3 days, or as long as your doctor tells you, avoid: ? Douching. ? Using tampons. ? Having sex.  If you use birth control (contraception), keep using it.  Limit activity for the first day after the procedure. Ask your doctor what activities are safe for you.  It is up to you to get the results of your procedure. Ask your doctor when your results will be ready.  Keep all follow-up visits as told by your doctor. This is important. Contact a doctor if:  You get a skin rash. Get help right away if:  You are bleeding a lot from your vagina. It is a lot of bleeding if you are using more than one pad an hour for 2 hours in a row.  You have clumps of blood (blood clots) coming from your vagina.  You have a fever.  You have chills  You have pain in your lower belly (pelvic  area).  You have signs of infection, such as vaginal discharge that is: ? Different than usual. ? Yellow. ? Bad-smelling.  You have very pain or cramps in your lower belly that do not get better with medicine.  You feel light-headed.  You feel dizzy.  You pass out (faint). Summary  If you did not have a tissue sample removed (did not have a biopsy), you may only have some spotting for a few days. You can go back to your normal activities.  If you had a tissue sample removed, it is common to have mild pain and spotting for 48 hours.  For 3 days, or as long as your doctor tells you, avoid douching, using tampons and having sex.  Get help right away if you have bleeding, very bad pain, or signs of infection. This information is not intended to replace advice given to you by your health care provider. Make sure you discuss any questions you have with your health care provider. Document Revised: 03/01/2017 Document Reviewed: 12/07/2015 Elsevier Patient Education  2020 [employer].

## 2020-02-01 DIAGNOSIS — Z419 Encounter for procedure for purposes other than remedying health state, unspecified: Secondary | ICD-10-CM | POA: Diagnosis not present

## 2020-03-02 DIAGNOSIS — Z419 Encounter for procedure for purposes other than remedying health state, unspecified: Secondary | ICD-10-CM | POA: Diagnosis not present

## 2020-04-02 DIAGNOSIS — Z419 Encounter for procedure for purposes other than remedying health state, unspecified: Secondary | ICD-10-CM | POA: Diagnosis not present

## 2020-05-03 DIAGNOSIS — Z419 Encounter for procedure for purposes other than remedying health state, unspecified: Secondary | ICD-10-CM | POA: Diagnosis not present

## 2020-05-31 DIAGNOSIS — Z419 Encounter for procedure for purposes other than remedying health state, unspecified: Secondary | ICD-10-CM | POA: Diagnosis not present

## 2020-07-01 DIAGNOSIS — Z419 Encounter for procedure for purposes other than remedying health state, unspecified: Secondary | ICD-10-CM | POA: Diagnosis not present

## 2020-07-31 DIAGNOSIS — Z419 Encounter for procedure for purposes other than remedying health state, unspecified: Secondary | ICD-10-CM | POA: Diagnosis not present

## 2020-08-31 DIAGNOSIS — Z419 Encounter for procedure for purposes other than remedying health state, unspecified: Secondary | ICD-10-CM | POA: Diagnosis not present

## 2020-09-30 DIAGNOSIS — Z419 Encounter for procedure for purposes other than remedying health state, unspecified: Secondary | ICD-10-CM | POA: Diagnosis not present

## 2020-10-31 DIAGNOSIS — Z419 Encounter for procedure for purposes other than remedying health state, unspecified: Secondary | ICD-10-CM | POA: Diagnosis not present

## 2020-11-04 IMAGING — CR DG CHEST 2V
2 series · 2 of 2 positions shown · non-contrast
Comparison: 11/01/2017

CLINICAL DATA: Chest pain

EXAM:
CHEST - 2 VIEW

[chest pa]
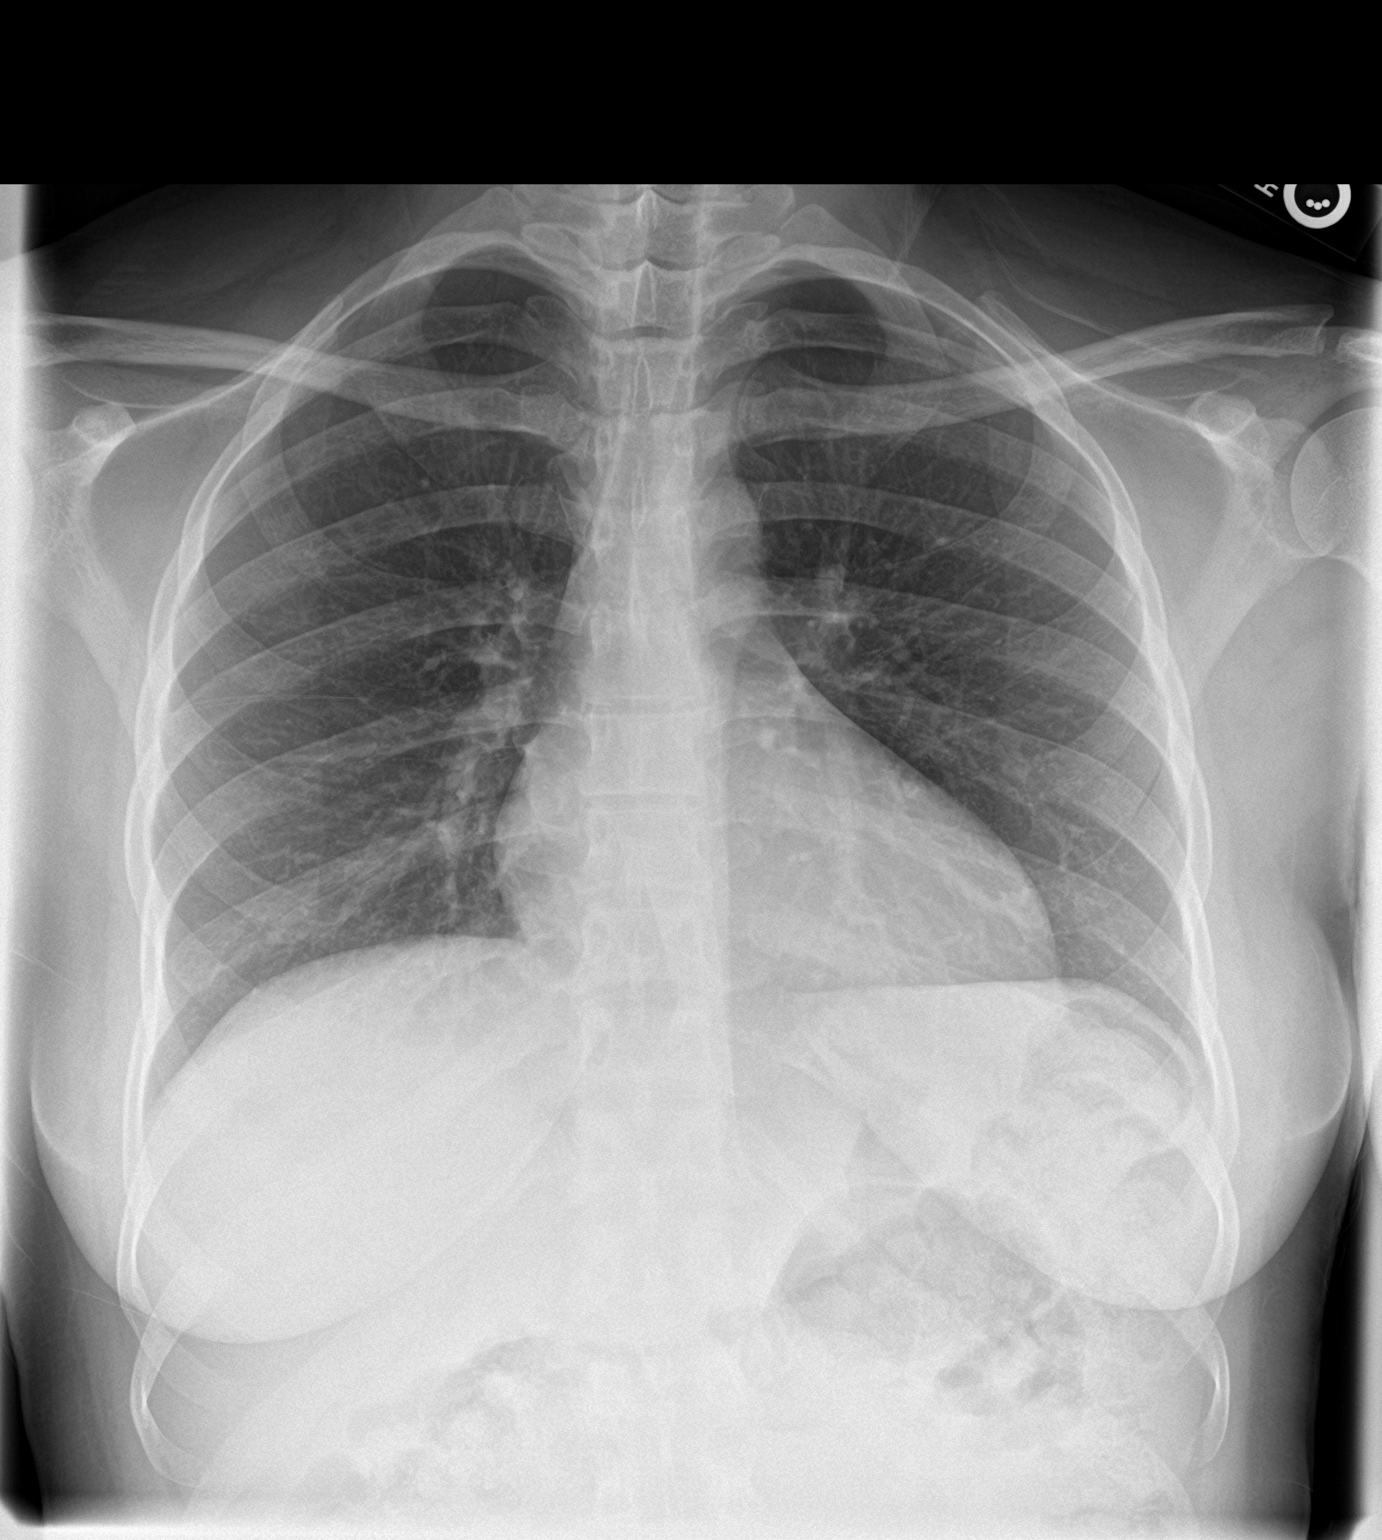

[chest lat]
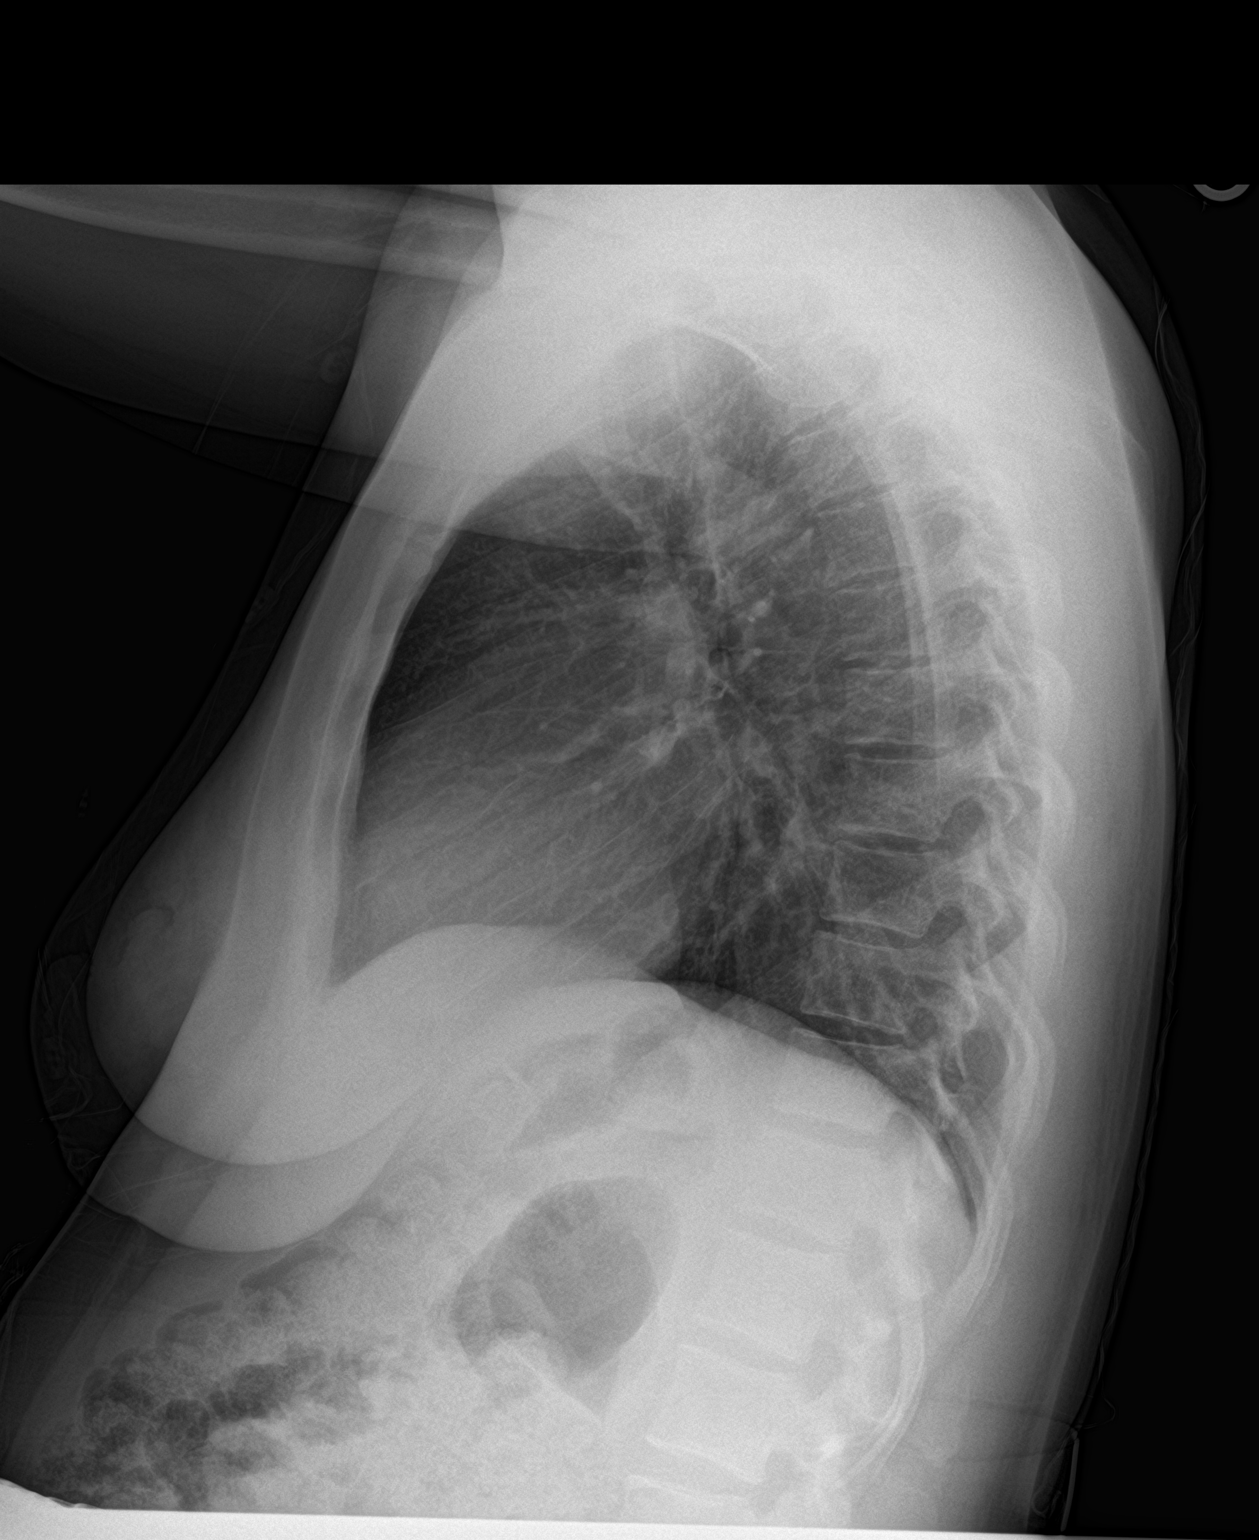

[2 of 2 positions shown; findings below may reference images not displayed]

FINDINGS: The heart size and mediastinal contours are within normal limits.
Both lungs are clear. The visualized skeletal structures are
unremarkable.
IMPRESSION: No active cardiopulmonary disease.

## 2020-12-01 DIAGNOSIS — Z419 Encounter for procedure for purposes other than remedying health state, unspecified: Secondary | ICD-10-CM | POA: Diagnosis not present

## 2020-12-31 DIAGNOSIS — Z419 Encounter for procedure for purposes other than remedying health state, unspecified: Secondary | ICD-10-CM | POA: Diagnosis not present

## 2021-01-11 IMAGING — US US MFM FETAL BPP W/O NON-STRESS
1 series · 14 of 28 positions shown · non-contrast
Comparison: none

[Series 1: us mfm fetal bpp w/o non-stress · 58 acquisitions, 14 frames shown]
[im 3/58]
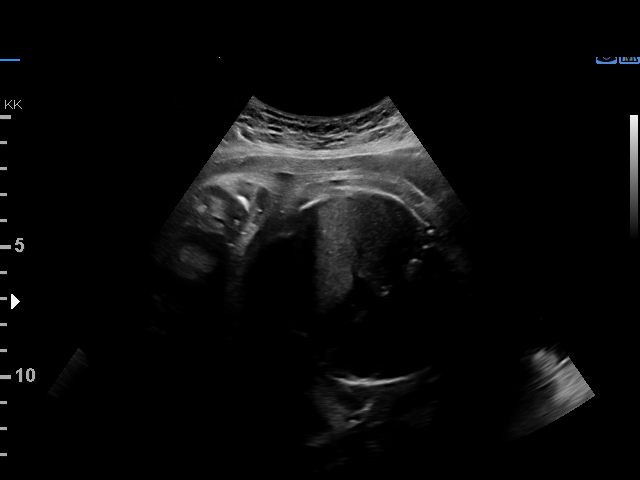
[im 7/58]
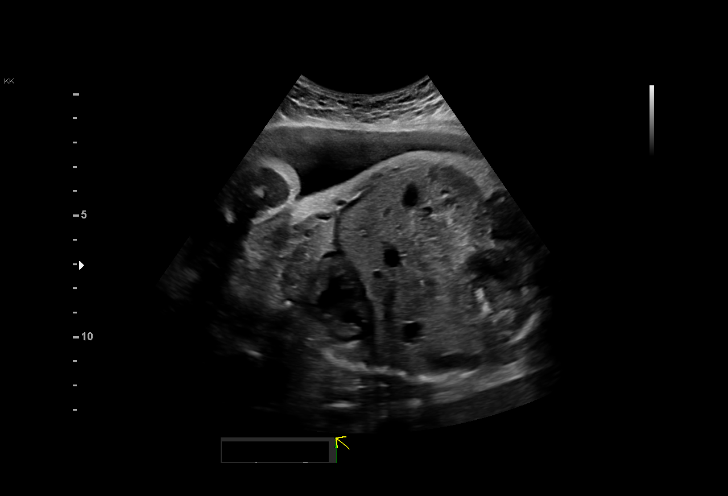
[im 11/58]
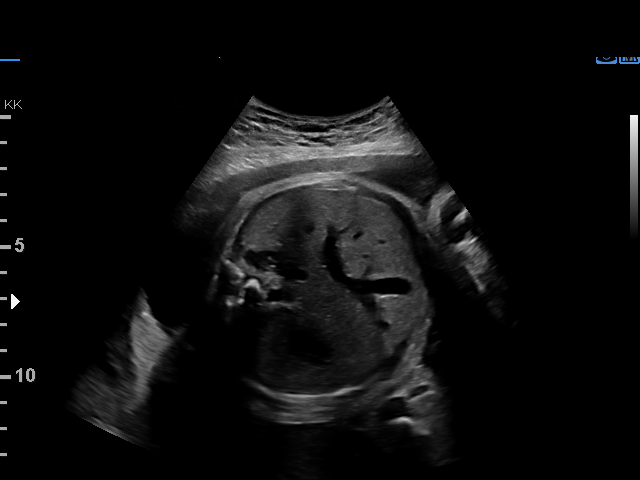
[im 15/58]
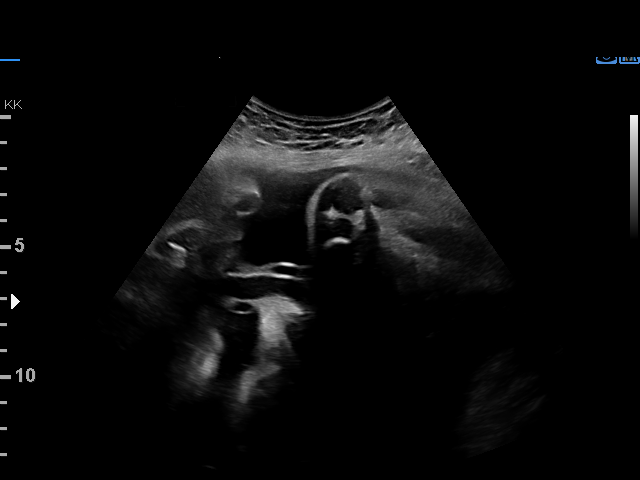
[im 20/58]
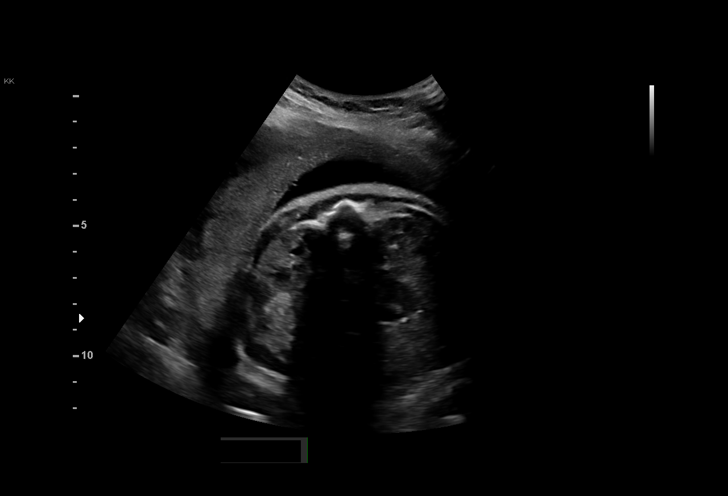
[im 24/58]
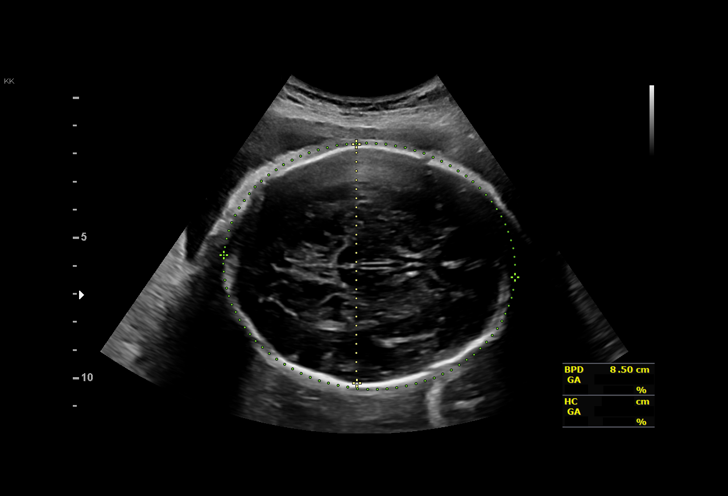
[im 28/58]
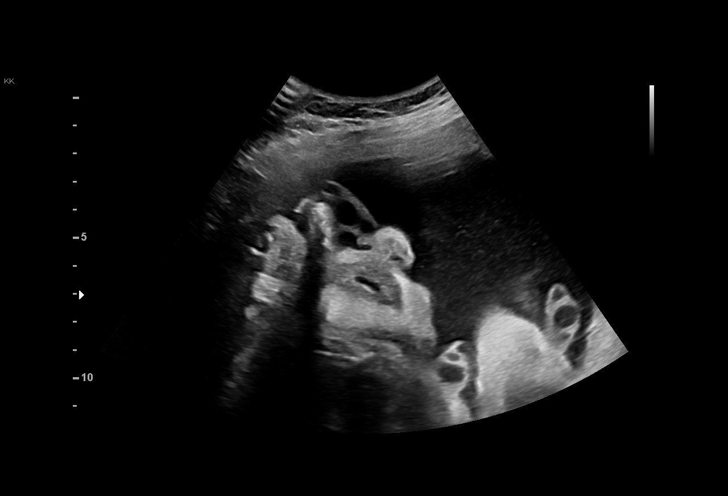
[im 32/58]
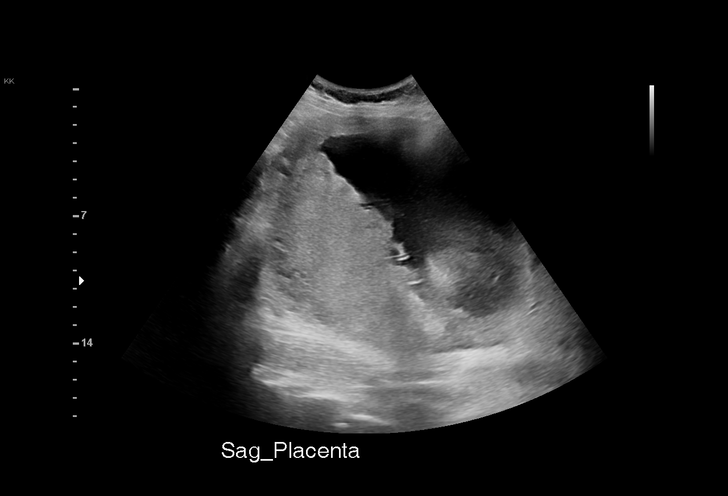
[im 36/58]
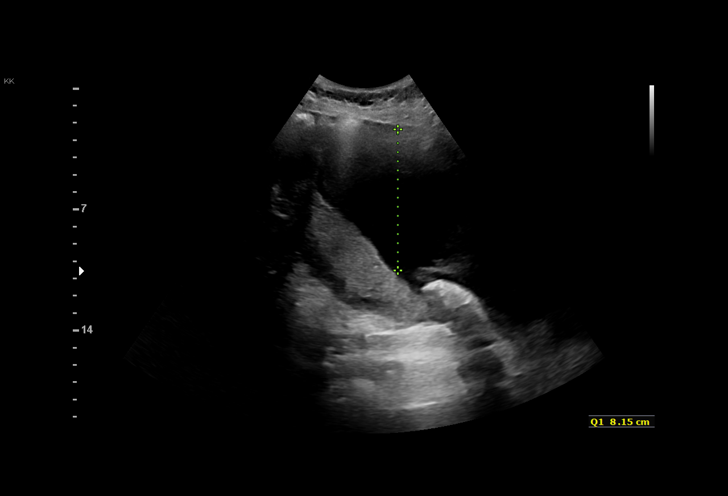
[im 41/58]
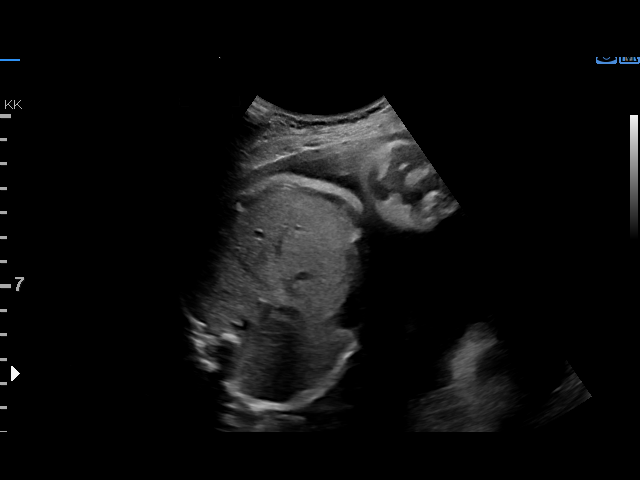
[im 45/58]
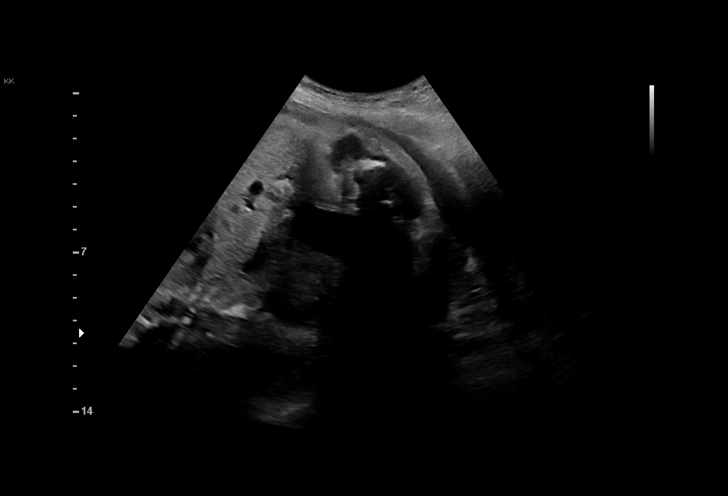
[im 49/58]
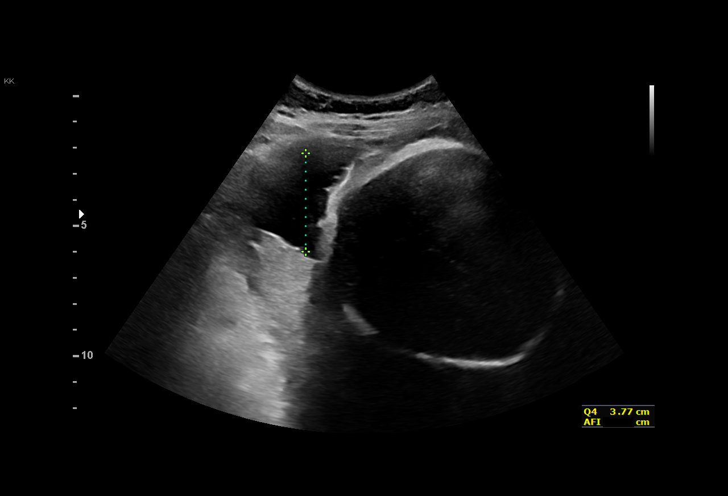
[im 53/58]
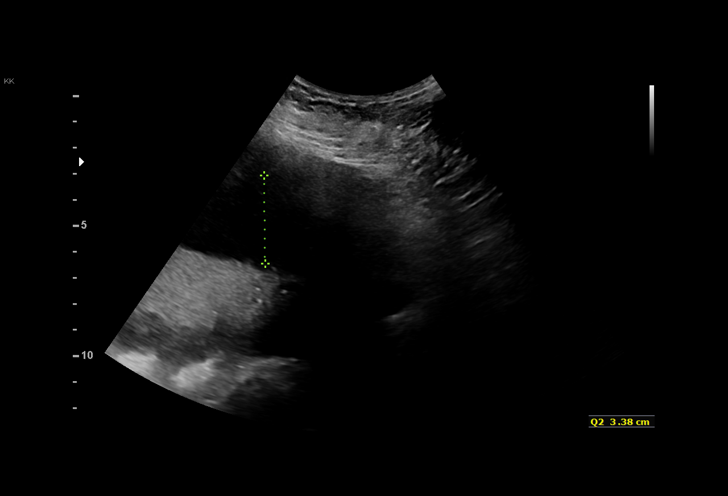
[im 58/58]
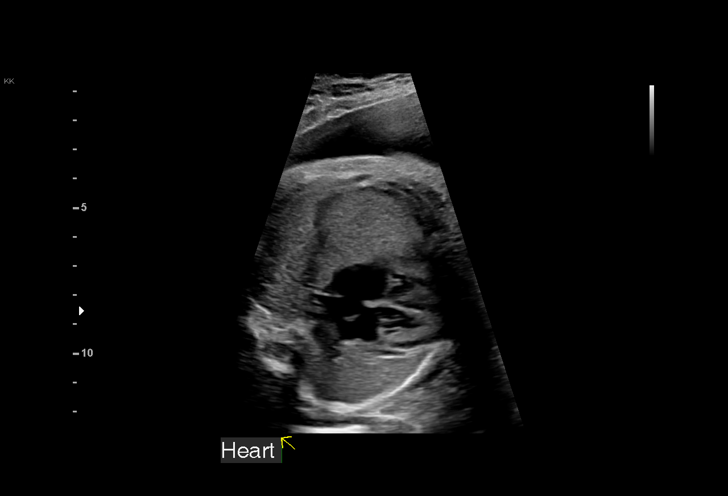

[14 of 28 positions shown; findings below may reference images not displayed]

[REDACTED]
 Ref. Address:     [HOSPITAL]
                   [REDACTED]

Indications

 Medical complication of pregnancy (Covid
 Infection 10/13/19)
 Anemia during pregnancy in third trimester
 33 weeks gestation of pregnancy
 Encounter for other antenatal screening
 follow-up
Fetal Evaluation

 Num Of Fetuses:         1
 Fetal Heart Rate(bpm):  135
 Cardiac Activity:       Observed
 Presentation:           Variable
 Placenta:               Posterior
 P. Cord Insertion:      Previously Visualized

 Amniotic Fluid
 AFI FV:      Within normal limits

 AFI Sum(cm)     %Tile       Largest Pocket(cm)
 22.29           85

 RUQ(cm)       RLQ(cm)       LUQ(cm)        LLQ(cm)

Biophysical Evaluation

 Amniotic F.V:   Pocket < 2 cm two          F. Tone:        Observed
                 planes
 F. Movement:    Observed                   Score:          [DATE]
 F. Breathing:   Observed
Biometry

 BPD:      85.2  mm     G. Age:  34w 2d         78  %    CI:        78.76   %    70 - 86
                                                         FL/HC:      20.9   %    19.9 -
 HC:      303.6  mm     G. Age:  33w 5d         28  %    HC/AC:      0.99        0.96 -
 AC:      307.8  mm     G. Age:  34w 5d         90  %    FL/BPD:     74.4   %    71 - 87
 FL:       63.4  mm     G. Age:  32w 5d         29  %    FL/AC:      20.6   %    20 - 24

 Est. FW:    8339  gm      5 lb 2 oz     70  %
OB History

 Gravidity:    3         Term:   2        Prem:   0        SAB:   0
 TOP:          0       Ectopic:  0        Living: 2
Gestational Age

 LMP:           33w 1d        Date:  03/17/19                 EDD:   12/22/19
 U/S Today:     33w 6d                                        EDD:   12/17/19
 Best:          33w 1d     Det. By:  LMP  (03/17/19)          EDD:   12/22/19
Anatomy

 Cranium:               Appears normal         Aortic Arch:            Previously seen
 Cavum:                 Previously seen        Ductal Arch:            Previously seen
 Ventricles:            Appears normal         Diaphragm:              Appears normal
 Choroid Plexus:        Previously seen        Stomach:                Appears normal, left
                                                                       sided
 Cerebellum:            Previously seen        Abdomen:                Appears normal
 Posterior Fossa:       Previously seen        Abdominal Wall:         Previously seen
 Nuchal Fold:           Not applicable (>20    Cord Vessels:           Previously seen
                        wks GA)
 Face:                  Orbits and profile     Kidneys:                Appear normal
                        previously seen
 Lips:                  Appears normal         Bladder:                Appears normal
 Thoracic:              Appears normal         Spine:                  Previously seen
 Heart:                 Previously seen        Upper Extremities:      Previously seen
 RVOT:                  Appears normal         Lower Extremities:      Previously seen
                        (limited prev)
 LVOT:                  Previously seen

 Other:  Male gender. Technically difficult due to fetal position. 3VV and 3VTV
         previously visualized.
Cervix Uterus Adnexa

 Cervix
 Not visualized (advanced GA >50wks)
Comments

 This patient was seen for a follow up growth scan as she was
 diagnosed with [id number] last month.  She denies any
 problems related to the Covid infection.
 She was informed that the fetal growth and amniotic fluid
 level appears appropriate for her gestational age.
 A biophysical profile performed today was [DATE].
 A follow up exam was scheduled in 4 weeks.

## 2021-01-31 DIAGNOSIS — Z419 Encounter for procedure for purposes other than remedying health state, unspecified: Secondary | ICD-10-CM | POA: Diagnosis not present

## 2021-03-02 DIAGNOSIS — Z419 Encounter for procedure for purposes other than remedying health state, unspecified: Secondary | ICD-10-CM | POA: Diagnosis not present

## 2021-03-13 DIAGNOSIS — Z23 Encounter for immunization: Secondary | ICD-10-CM | POA: Diagnosis not present

## 2021-04-02 DIAGNOSIS — Z419 Encounter for procedure for purposes other than remedying health state, unspecified: Secondary | ICD-10-CM | POA: Diagnosis not present

## 2021-05-03 DIAGNOSIS — Z419 Encounter for procedure for purposes other than remedying health state, unspecified: Secondary | ICD-10-CM | POA: Diagnosis not present

## 2021-05-31 DIAGNOSIS — Z419 Encounter for procedure for purposes other than remedying health state, unspecified: Secondary | ICD-10-CM | POA: Diagnosis not present

## 2021-07-01 DIAGNOSIS — Z419 Encounter for procedure for purposes other than remedying health state, unspecified: Secondary | ICD-10-CM | POA: Diagnosis not present

## 2021-07-31 DIAGNOSIS — Z419 Encounter for procedure for purposes other than remedying health state, unspecified: Secondary | ICD-10-CM | POA: Diagnosis not present

## 2021-08-31 DIAGNOSIS — Z419 Encounter for procedure for purposes other than remedying health state, unspecified: Secondary | ICD-10-CM | POA: Diagnosis not present

## 2021-09-30 DIAGNOSIS — Z419 Encounter for procedure for purposes other than remedying health state, unspecified: Secondary | ICD-10-CM | POA: Diagnosis not present

## 2021-10-31 DIAGNOSIS — Z419 Encounter for procedure for purposes other than remedying health state, unspecified: Secondary | ICD-10-CM | POA: Diagnosis not present

## 2021-11-23 ENCOUNTER — Encounter (HOSPITAL_COMMUNITY): Payer: Self-pay

## 2021-11-23 ENCOUNTER — Ambulatory Visit (HOSPITAL_COMMUNITY)
Admission: EM | Admit: 2021-11-23 | Discharge: 2021-11-23 | Disposition: A | Discharge status: Home / Self Care | Payer: Medicaid Other | Attending: Physician Assistant | Admitting: Physician Assistant

## 2021-11-23 DIAGNOSIS — J069 Acute upper respiratory infection, unspecified: Secondary | ICD-10-CM

## 2021-11-23 DIAGNOSIS — J029 Acute pharyngitis, unspecified: Secondary | ICD-10-CM

## 2021-11-23 DIAGNOSIS — Z20822 Contact with and (suspected) exposure to covid-19: Secondary | ICD-10-CM | POA: Diagnosis not present

## 2021-11-23 LAB — RESP PANEL BY RT-PCR (FLU A&B, COVID) ARPGX2
Influenza A by PCR: NEGATIVE
Influenza B by PCR: NEGATIVE
SARS Coronavirus 2 by RT PCR: NEGATIVE

## 2021-11-23 LAB — POCT RAPID STREP A, ED / UC: Streptococcus, Group A Screen (Direct): NEGATIVE

## 2021-11-23 MED ORDER — DM-GUAIFENESIN ER 30-600 MG PO TB12: 1.0000 | ORAL_TABLET | Freq: Two times a day (BID) | ORAL | 0 refills | Status: DC

## 2021-11-23 NOTE — ED Provider Notes (Signed)
[facility]    CSN: [id number] Arrival date & time: 11/23/21  1514      History   Chief Complaint Chief Complaint  Patient presents with   Cough   Sore Throat    HPI Robin Cordova is a 31 y.o. female.   31 year old female presents with cough, congestion and sore throat.  Patient indicates that her children became sick first beginning with her 49-year-old daughter.  Patient indicates for the past 2 days she has been having cough which is intermittent, chest congestion, upper respiratory symptoms of sinus congestion, rhinitis which is yellow to light green.  Patient indicates she has been having sore throat and painful swallowing for the past 2 days which has improved this morning.  She indicates having mild fatigue and body aches.  Patient denies fever or chills.  She is concerned about having possible strep throat and work has asked her to get a COVID test because of her symptoms.  She denies nausea or vomiting.   Cough Associated symptoms: rhinorrhea and sore throat   Sore Throat    Past Medical History:  Diagnosis Date   Anemia     Patient Active Problem List   Diagnosis Date Noted   Indication for care in labor and delivery, antepartum 12/23/2019   Vaginal delivery 12/23/2019   Anemia affecting pregnancy 10/01/2019   History of episiotomy 08/26/2019   LGSIL on Pap smear of cervix 08/19/2019   Herpes simplex virus infection 08/19/2019   Positive antibody screen  08/19/2019   Supervision of low-risk pregnancy 08/03/2019    Past Surgical History:  Procedure Laterality Date   NO PAST SURGERIES      OB History     Gravida  3   Para  3   Term  3   Preterm  0   AB  0   Living  3      SAB      IAB      Ectopic      Multiple  0   Live Births  3            Home Medications    Prior to Admission medications   Medication Sig Start Date End Date Taking? Authorizing Provider  dextromethorphan-guaiFENesin (MUCINEX DM) 30-600 MG 12hr  tablet Take 1 tablet by mouth 2 (two) times daily. For cough. 11/23/21  Yes Ellsworth Lennox, PA-C  acetaminophen (TYLENOL) 325 MG tablet Take 2 tablets (650 mg total) by mouth every 6 (six) hours as needed for moderate pain or fever (for pain scale < 4). 12/25/19   Sabino Dick, DO  coconut oil OIL Apply 1 application topically as needed. 12/25/19   Sabino Dick, DO  ferrous sulfate 324 (65 Fe) MG TBEC Take 1 tablet (324 mg total) by mouth every other day. 10/01/19   Latrelle Dodrill, MD  hydrocortisone 1 % ointment Apply 1 application topically 2 (two) times daily. Patient not taking: Reported on 12/24/2019 11/23/19   Mirian Mo, MD  ibuprofen (ADVIL) 800 MG tablet Take 1 tablet (800 mg total) by mouth every 8 (eight) hours. 12/25/19   Sabino Dick, DO  Prenatal Vit-Fe Fumarate-FA (PRENATAL MULTIVITAMIN) TABS tablet Take 1 tablet by mouth daily at 12 noon. 09/24/19   Latrelle Dodrill, MD  valACYclovir (VALTREX) 500 MG tablet Take 1 tablet (500 mg total) by mouth 2 (two) times daily. 11/20/19   Mirian Mo, MD    Family History Family History  Problem Relation Age of Onset  Healthy Mother    Healthy Father    Healthy Daughter    Healthy Son    Cancer Neg Hx    Stroke Neg Hx     Social History Social History   Tobacco Use   Smoking status: Never   Smokeless tobacco: Never  Substance Use Topics   Alcohol use: Never   Drug use: Never     Allergies   Patient has no known allergies.   Review of Systems Review of Systems  HENT:  Positive for rhinorrhea and sore throat.   Respiratory:  Positive for cough.      Physical Exam Triage Vital Signs ED Triage Vitals  Enc Vitals Group     BP 11/23/21 1615 117/75     Pulse Rate 11/23/21 1615 76     Resp 11/23/21 1615 16     Temp 11/23/21 1615 97.7 F (36.5 C)     Temp Source 11/23/21 1615 Oral     SpO2 11/23/21 1615 98 %     Weight 11/23/21 1616 160 lb 6.4 oz (72.8 kg)     Height 11/23/21 1616 5\' 2"  (1.575  m)     Head Circumference --      Peak Flow --      Pain Score 11/23/21 1616 5     Pain Loc --      Pain Edu? --      Excl. in GC? --    No data found.  Updated Vital Signs BP 117/75 (BP Location: Left Arm)   Pulse 76   Temp 97.7 F (36.5 C) (Oral)   Resp 16   Ht 5\' 2"  (1.575 m)   Wt 160 lb 6.4 oz (72.8 kg)   LMP 10/14/2021 (Within Weeks)   SpO2 98%   BMI 29.34 kg/m   Visual Acuity Right Eye Distance:   Left Eye Distance:   Bilateral Distance:    Right Eye Near:   Left Eye Near:    Bilateral Near:     Physical Exam Constitutional:      Appearance: She is well-developed.  HENT:     Right Ear: Tympanic membrane and ear canal normal.     Left Ear: Tympanic membrane and ear canal normal.     Mouth/Throat:     Mouth: Mucous membranes are moist.     Pharynx: Oropharynx is clear. Posterior oropharyngeal erythema present. No pharyngeal swelling or oropharyngeal exudate.  Cardiovascular:     Rate and Rhythm: Normal rate and regular rhythm.     Heart sounds: Normal heart sounds.  Pulmonary:     Effort: Pulmonary effort is normal.     Breath sounds: Normal breath sounds and air entry. No wheezing, rhonchi or rales.  Lymphadenopathy:     Cervical: No cervical adenopathy.  Neurological:     Mental Status: She is alert.      UC Treatments / Results  Labs (all labs ordered are listed, but only abnormal results are displayed) Labs Reviewed  RESP PANEL BY RT-PCR (FLU A&B, COVID) [id number]  CULTURE, GROUP A STREP [facility])  POCT RAPID STREP A, ED / UC    EKG   Radiology No results found.  Procedures Procedures (including critical care time)  Medications Ordered in UC Medications - No data to display  Initial Impression / Assessment and Plan / UC Course  I have reviewed the triage vital signs and the nursing notes.  Pertinent labs & imaging results that were available during my care of the patient were  reviewed by me and considered in my medical decision making  (see chart for details).    Plan: 1.  Throat culture is pending. 2.  Advised take ibuprofen or Motrin for fever or body pain. 3.  COVID and flu test is pending 4.  Advised to take Mucinex DM every 12 hours for cough and congestion. 5.  Advised to follow-up with PCP or return to urgent care if symptoms fail to improve. Final Clinical Impressions(s) / UC Diagnoses   Final diagnoses:  Sore throat  Viral URI with cough     Discharge Instructions      COVID and flu tests will be completed in 24 hours or less.  If you do not get a call from this office that indicates the test are negative.  You can go on MyChart to review the results when they post in less than 24 hours. Advised to use Mucinex DM for cough and congestion. Advised to use Tylenol or ibuprofen for aches pains fever. Advised to follow-up PCP or return to urgent care if symptoms fail to improve.     ED Prescriptions     Medication Sig Dispense Auth. Provider   dextromethorphan-guaiFENesin (MUCINEX DM) 30-600 MG 12hr tablet Take 1 tablet by mouth 2 (two) times daily. For cough. 20 tablet Ellsworth Lennox, PA-C      PDMP not reviewed this encounter.   Ellsworth Lennox, PA-C 11/23/21 1705

## 2021-11-23 NOTE — Discharge Instructions (Addendum)
COVID and flu tests will be completed in 24 hours or less.  If you do not get a call from this office that indicates the test are negative.  You can go on MyChart to review the results when they post in less than 24 hours. Advised to use Mucinex DM for cough and congestion. Advised to use Tylenol or ibuprofen for aches pains fever. Advised to follow-up PCP or return to urgent care if symptoms fail to improve.

## 2021-11-23 NOTE — ED Triage Notes (Signed)
Cough and runny nose for 3 days. Sore throat onset of today.   No fever, non-productive cough. Her son was sick first.

## 2021-11-26 LAB — CULTURE, GROUP A STREP (THRC)

## 2021-12-01 DIAGNOSIS — Z419 Encounter for procedure for purposes other than remedying health state, unspecified: Secondary | ICD-10-CM | POA: Diagnosis not present

## 2021-12-31 DIAGNOSIS — Z419 Encounter for procedure for purposes other than remedying health state, unspecified: Secondary | ICD-10-CM | POA: Diagnosis not present

## 2022-01-31 DIAGNOSIS — Z419 Encounter for procedure for purposes other than remedying health state, unspecified: Secondary | ICD-10-CM | POA: Diagnosis not present

## 2022-03-02 DIAGNOSIS — Z419 Encounter for procedure for purposes other than remedying health state, unspecified: Secondary | ICD-10-CM | POA: Diagnosis not present

## 2022-04-02 DIAGNOSIS — Z419 Encounter for procedure for purposes other than remedying health state, unspecified: Secondary | ICD-10-CM | POA: Diagnosis not present

## 2022-05-03 DIAGNOSIS — Z419 Encounter for procedure for purposes other than remedying health state, unspecified: Secondary | ICD-10-CM | POA: Diagnosis not present

## 2022-06-01 DIAGNOSIS — Z419 Encounter for procedure for purposes other than remedying health state, unspecified: Secondary | ICD-10-CM | POA: Diagnosis not present

## 2022-07-02 DIAGNOSIS — Z419 Encounter for procedure for purposes other than remedying health state, unspecified: Secondary | ICD-10-CM | POA: Diagnosis not present

## 2022-08-01 DIAGNOSIS — Z419 Encounter for procedure for purposes other than remedying health state, unspecified: Secondary | ICD-10-CM | POA: Diagnosis not present

## 2022-09-01 DIAGNOSIS — Z419 Encounter for procedure for purposes other than remedying health state, unspecified: Secondary | ICD-10-CM | POA: Diagnosis not present

## 2022-10-01 DIAGNOSIS — Z419 Encounter for procedure for purposes other than remedying health state, unspecified: Secondary | ICD-10-CM | POA: Diagnosis not present

## 2022-11-01 DIAGNOSIS — Z419 Encounter for procedure for purposes other than remedying health state, unspecified: Secondary | ICD-10-CM | POA: Diagnosis not present

## 2022-12-02 DIAGNOSIS — Z419 Encounter for procedure for purposes other than remedying health state, unspecified: Secondary | ICD-10-CM | POA: Diagnosis not present

## 2022-12-20 ENCOUNTER — Encounter (HOSPITAL_COMMUNITY): Payer: Self-pay | Admitting: Emergency Medicine

## 2022-12-20 ENCOUNTER — Emergency Department (HOSPITAL_COMMUNITY): Payer: Medicaid Other

## 2022-12-20 ENCOUNTER — Other Ambulatory Visit (HOSPITAL_COMMUNITY): Payer: Medicaid Other

## 2022-12-20 ENCOUNTER — Emergency Department (HOSPITAL_COMMUNITY)
Admission: EM | Admit: 2022-12-20 | Discharge: 2022-12-20 | Disposition: A | Discharge status: Home / Self Care | Payer: Medicaid Other | Attending: Emergency Medicine | Admitting: Emergency Medicine

## 2022-12-20 ENCOUNTER — Other Ambulatory Visit: Payer: Self-pay

## 2022-12-20 DIAGNOSIS — M25512 Pain in left shoulder: Secondary | ICD-10-CM | POA: Diagnosis not present

## 2022-12-20 DIAGNOSIS — R519 Headache, unspecified: Secondary | ICD-10-CM | POA: Insufficient documentation

## 2022-12-20 DIAGNOSIS — M25552 Pain in left hip: Secondary | ICD-10-CM | POA: Insufficient documentation

## 2022-12-20 DIAGNOSIS — S0990XA Unspecified injury of head, initial encounter: Secondary | ICD-10-CM | POA: Diagnosis not present

## 2022-12-20 DIAGNOSIS — Y92410 Unspecified street and highway as the place of occurrence of the external cause: Secondary | ICD-10-CM | POA: Insufficient documentation

## 2022-12-20 DIAGNOSIS — S3991XA Unspecified injury of abdomen, initial encounter: Secondary | ICD-10-CM | POA: Diagnosis not present

## 2022-12-20 DIAGNOSIS — M25559 Pain in unspecified hip: Secondary | ICD-10-CM | POA: Diagnosis not present

## 2022-12-20 DIAGNOSIS — S161XXA Strain of muscle, fascia and tendon at neck level, initial encounter: Secondary | ICD-10-CM | POA: Insufficient documentation

## 2022-12-20 DIAGNOSIS — S199XXA Unspecified injury of neck, initial encounter: Secondary | ICD-10-CM | POA: Diagnosis not present

## 2022-12-20 DIAGNOSIS — S299XXA Unspecified injury of thorax, initial encounter: Secondary | ICD-10-CM | POA: Diagnosis not present

## 2022-12-20 DIAGNOSIS — S3993XA Unspecified injury of pelvis, initial encounter: Secondary | ICD-10-CM | POA: Diagnosis not present

## 2022-12-20 LAB — URINALYSIS, ROUTINE W REFLEX MICROSCOPIC
Bilirubin Urine: NEGATIVE
Glucose, UA: NEGATIVE mg/dL
Hgb urine dipstick: NEGATIVE
Ketones, ur: NEGATIVE mg/dL
Leukocytes,Ua: NEGATIVE
Nitrite: NEGATIVE
Protein, ur: NEGATIVE mg/dL
Specific Gravity, Urine: 1.002 — ABNORMAL LOW (ref 1.005–1.030)
pH: 7 (ref 5.0–8.0)

## 2022-12-20 LAB — COMPREHENSIVE METABOLIC PANEL
ALT: 39 U/L (ref 0–44)
AST: 48 U/L — ABNORMAL HIGH (ref 15–41)
Albumin: 3.6 g/dL (ref 3.5–5.0)
Alkaline Phosphatase: 92 U/L (ref 38–126)
Anion gap: 7 (ref 5–15)
BUN: 5 mg/dL — ABNORMAL LOW (ref 6–20)
CO2: 22 mmol/L (ref 22–32)
Calcium: 8.4 mg/dL — ABNORMAL LOW (ref 8.9–10.3)
Chloride: 108 mmol/L (ref 98–111)
Creatinine, Ser: 0.57 mg/dL (ref 0.44–1.00)
GFR, Estimated: >60 mL/min (ref >60)
Glucose, Bld: 99 mg/dL (ref 70–99)
Potassium: 3.5 mmol/L (ref 3.5–5.1)
Sodium: 137 mmol/L (ref 135–145)
Total Bilirubin: 0.7 mg/dL (ref 0.3–1.2)
Total Protein: 6.8 g/dL (ref 6.5–8.1)

## 2022-12-20 LAB — HCG, SERUM, QUALITATIVE: Preg, Serum: NEGATIVE

## 2022-12-20 LAB — I-STAT CHEM 8, ED
BUN: 4 mg/dL — ABNORMAL LOW (ref 6–20)
Calcium, Ion: 1.1 mmol/L — ABNORMAL LOW (ref 1.15–1.40)
Chloride: 107 mmol/L (ref 98–111)
Creatinine, Ser: 0.5 mg/dL (ref 0.44–1.00)
Glucose, Bld: 92 mg/dL (ref 70–99)
HCT: 32 % — ABNORMAL LOW (ref 36.0–46.0)
Hemoglobin: 10.9 g/dL — ABNORMAL LOW (ref 12.0–15.0)
Potassium: 3.7 mmol/L (ref 3.5–5.1)
Sodium: 140 mmol/L (ref 135–145)
TCO2: 22 mmol/L (ref 22–32)

## 2022-12-20 LAB — CBC
HCT: 32.7 % — ABNORMAL LOW (ref 36.0–46.0)
Hemoglobin: 9.7 g/dL — ABNORMAL LOW (ref 12.0–15.0)
MCH: 22.9 pg — ABNORMAL LOW (ref 26.0–34.0)
MCHC: 29.7 g/dL — ABNORMAL LOW (ref 30.0–36.0)
MCV: 77.1 fL — ABNORMAL LOW (ref 80.0–100.0)
Platelets: 339 10*3/uL (ref 150–400)
RBC: 4.24 MIL/uL (ref 3.87–5.11)
RDW: 16.8 % — ABNORMAL HIGH (ref 11.5–15.5)
WBC: 5.2 10*3/uL (ref 4.0–10.5)
nRBC: 0 % (ref 0.0–0.2)

## 2022-12-20 MED ORDER — CYCLOBENZAPRINE HCL 10 MG PO TABS: 10.0000 mg | ORAL_TABLET | Freq: Two times a day (BID) | ORAL | 0 refills | Status: DC | PRN

## 2022-12-20 MED ORDER — MORPHINE SULFATE (PF) 2 MG/ML IV SOLN
2.0000 mg | Freq: Once | INTRAVENOUS | Status: AC
  Administered 2022-12-20: 2 mg via INTRAVENOUS
  Filled 2022-12-20: qty 1

## 2022-12-20 MED ORDER — LIDOCAINE 5 % EX PTCH
1.0000 | MEDICATED_PATCH | CUTANEOUS | Status: DC
  Administered 2022-12-20: 1 via TRANSDERMAL
  Filled 2022-12-20: qty 1

## 2022-12-20 MED ORDER — IOHEXOL 350 MG/ML SOLN
65.0000 mL | Freq: Once | INTRAVENOUS | Status: AC | PRN
  Administered 2022-12-20: 65 mL via INTRAVENOUS
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
## 2023-01-01 DIAGNOSIS — Z419 Encounter for procedure for purposes other than remedying health state, unspecified: Secondary | ICD-10-CM | POA: Diagnosis not present

## 2023-02-01 DIAGNOSIS — Z419 Encounter for procedure for purposes other than remedying health state, unspecified: Secondary | ICD-10-CM | POA: Diagnosis not present

## 2023-03-03 DIAGNOSIS — Z419 Encounter for procedure for purposes other than remedying health state, unspecified: Secondary | ICD-10-CM | POA: Diagnosis not present

## 2023-04-03 DIAGNOSIS — Z419 Encounter for procedure for purposes other than remedying health state, unspecified: Secondary | ICD-10-CM | POA: Diagnosis not present

## 2023-05-04 DIAGNOSIS — Z419 Encounter for procedure for purposes other than remedying health state, unspecified: Secondary | ICD-10-CM | POA: Diagnosis not present

## 2023-06-01 DIAGNOSIS — Z419 Encounter for procedure for purposes other than remedying health state, unspecified: Secondary | ICD-10-CM | POA: Diagnosis not present

## 2023-07-13 DIAGNOSIS — Z419 Encounter for procedure for purposes other than remedying health state, unspecified: Secondary | ICD-10-CM | POA: Diagnosis not present

## 2023-08-12 DIAGNOSIS — Z419 Encounter for procedure for purposes other than remedying health state, unspecified: Secondary | ICD-10-CM | POA: Diagnosis not present

## 2023-09-12 DIAGNOSIS — Z419 Encounter for procedure for purposes other than remedying health state, unspecified: Secondary | ICD-10-CM | POA: Diagnosis not present

## 2023-10-12 DIAGNOSIS — Z419 Encounter for procedure for purposes other than remedying health state, unspecified: Secondary | ICD-10-CM | POA: Diagnosis not present

## 2023-11-12 DIAGNOSIS — Z419 Encounter for procedure for purposes other than remedying health state, unspecified: Secondary | ICD-10-CM | POA: Diagnosis not present

## 2023-11-18 ENCOUNTER — Ambulatory Visit (HOSPITAL_COMMUNITY)
Admission: EM | Admit: 2023-11-18 | Discharge: 2023-11-18 | Disposition: A | Discharge status: Home / Self Care | Attending: Family Medicine | Admitting: Family Medicine

## 2023-11-18 ENCOUNTER — Encounter (HOSPITAL_COMMUNITY): Payer: Self-pay

## 2023-11-18 ENCOUNTER — Other Ambulatory Visit (HOSPITAL_COMMUNITY): Payer: Self-pay

## 2023-11-18 DIAGNOSIS — R519 Headache, unspecified: Secondary | ICD-10-CM

## 2023-11-18 MED ORDER — IBUPROFEN 800 MG PO TABS
800.0000 mg | ORAL_TABLET | Freq: Three times a day (TID) | ORAL | 0 refills | Status: AC | PRN
  Filled 2023-11-18: qty 21, 7d supply, fill #0

## 2023-11-18 NOTE — ED Provider Notes (Signed)
 [facility]    CSN: [id number] Arrival date & time: 11/18/23  1442      History   Chief Complaint Chief Complaint  Patient presents with   Headache    HPI Robin Cordova is a 33 y.o. female.    Headache  Here for frontal headache that began bothering her about midnight overnight last night.  No fever or chills.  She has had headaches like this before she says.  The pain is a constant dull ache.  There is no photophobia or nausea or vomiting.  She states she has not been getting much rest, getting her kids ready for school.  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  She has not been able to sleep today and feels that is worse in the headache.  NKDA  Last menstrual cycle was August 3 Past Medical History:  Diagnosis Date   Anemia     Patient Active Problem List   Diagnosis Date Noted   Indication for care in labor and delivery, antepartum 12/23/2019   Vaginal delivery 12/23/2019   Anemia affecting pregnancy 10/01/2019   History of episiotomy 08/26/2019   LGSIL on Pap smear of cervix 08/19/2019   Herpes simplex virus infection 08/19/2019   Positive antibody screen  08/19/2019   Supervision of low-risk pregnancy 08/03/2019    Past Surgical History:  Procedure Laterality Date   NO PAST SURGERIES      OB History     Gravida  3   Para  3   Term  3   Preterm  0   AB  0   Living  3      SAB      IAB      Ectopic      Multiple  0   Live Births  3            Home Medications    Prior to Admission medications   Medication Sig Start Date End Date Taking? Authorizing Provider  ibuprofen  (ADVIL ) 800 MG tablet Take 1 tablet (800 mg total) by mouth every 8 (eight) hours as needed (pain). 11/18/23  Yes Vonna Sharlet POUR, MD  acetaminophen  (TYLENOL ) 325 MG tablet Take 2 tablets (650 mg total) by mouth every 6 (six) hours as needed for moderate pain or fever (for pain scale < 4). 12/25/19   Espinoza, Alejandra, DO  coconut oil OIL  Apply 1 application topically as needed. 12/25/19   Espinoza, Alejandra, DO  hydrocortisone  1 % ointment Apply 1 application topically 2 (two) times daily. Patient not taking: Reported on 12/24/2019 11/23/19   Dempsey Coy, MD  valACYclovir  (VALTREX ) 500 MG tablet Take 1 tablet (500 mg total) by mouth 2 (two) times daily. Patient not taking: Reported on 11/18/2023 11/20/19   Dempsey Coy, MD    Family History Family History  Problem Relation Age of Onset   Healthy Mother    Healthy Father    Healthy Daughter    Healthy Son    Cancer Neg Hx    Stroke Neg Hx     Social History Social History   Tobacco Use   Smoking status: Never   Smokeless tobacco: Never  Substance Use Topics   Alcohol use: Never   Drug use: Never     Allergies   Patient has no known allergies.   Review of Systems Review of Systems  Neurological:  Positive for headaches.     Physical Exam Triage Vital Signs ED Triage Vitals [11/18/23  1558]  Encounter Vitals Group     BP 108/66     Girls Systolic BP Percentile      Girls Diastolic BP Percentile      Boys Systolic BP Percentile      Boys Diastolic BP Percentile      Pulse Rate (!) 56     Resp 16     Temp 97.9 F (36.6 C)     Temp Source Oral     SpO2 98 %     Weight      Height      Head Circumference      Peak Flow      Pain Score 4     Pain Loc      Pain Education      Exclude from Growth Chart    No data found.  Updated Vital Signs BP 108/66 (BP Location: Right Arm)   Pulse (!) 56   Temp 97.9 F (36.6 C) (Oral)   Resp 16   LMP 11/03/2023 (Exact Date)   SpO2 98%   Breastfeeding No   Visual Acuity Right Eye Distance:   Left Eye Distance:   Bilateral Distance:    Right Eye Near:   Left Eye Near:    Bilateral Near:     Physical Exam Vitals reviewed.  Constitutional:      General: She is not in acute distress.    Appearance: She is not toxic-appearing.  HENT:     Right Ear: Tympanic membrane and ear canal normal.      Left Ear: Tympanic membrane and ear canal normal.     Nose: Nose normal.     Mouth/Throat:     Mouth: Mucous membranes are moist.     Pharynx: No oropharyngeal exudate or posterior oropharyngeal erythema.  Eyes:     Extraocular Movements: Extraocular movements intact.     Conjunctiva/sclera: Conjunctivae normal.     Pupils: Pupils are equal, round, and reactive to light.  Cardiovascular:     Rate and Rhythm: Normal rate and regular rhythm.     Heart sounds: No murmur heard. Pulmonary:     Effort: Pulmonary effort is normal. No respiratory distress.     Breath sounds: No wheezing, rhonchi or rales.  Chest:     Chest wall: No tenderness.  Musculoskeletal:     Cervical back: Neck supple.  Lymphadenopathy:     Cervical: No cervical adenopathy.  Skin:    Capillary Refill: Capillary refill takes less than 2 seconds.     Coloration: Skin is not jaundiced or pale.  Neurological:     General: No focal deficit present.     Mental Status: She is alert and oriented to person, place, and time.     Cranial Nerves: No cranial nerve deficit.     Sensory: No sensory deficit.     Motor: No weakness.     Coordination: Coordination normal.     Gait: Gait normal.     Deep Tendon Reflexes: Reflexes normal.  Psychiatric:        Behavior: Behavior normal.      UC Treatments / Results  Labs (all labs ordered are listed, but only abnormal results are displayed) Labs Reviewed - No data to display  EKG   Radiology No results found.  Procedures Procedures (including critical care time)  Medications Ordered in UC Medications - No data to display  Initial Impression / Assessment and Plan / UC Course  I have reviewed the triage vital  signs and the nursing notes.  Pertinent labs & imaging results that were available during my care of the patient were reviewed by me and considered in my medical decision making (see chart for details).     This seems to be a tension headache from lack of  rest.  She declines my offer of a Toradol injection.  Ibuprofen  is sent in for the pain.  She states she is going to be able to rest today.  Work note is provided Final Clinical Impressions(s) / UC Diagnoses   Final diagnoses:  Acute intractable headache, unspecified headache type     Discharge Instructions      Take ibuprofen  800 mg--1 tab every 8 hours as needed for pain.  Make sure you are drinking plenty of oral fluids.      ED Prescriptions     Medication Sig Dispense Auth. Provider   ibuprofen  (ADVIL ) 800 MG tablet Take 1 tablet (800 mg total) by mouth every 8 (eight) hours as needed (pain). 21 tablet Azyria Osmon K, MD      PDMP not reviewed this encounter.   Vonna Sharlet POUR, MD 11/18/23 [phone]

## 2023-11-18 NOTE — ED Triage Notes (Signed)
 Patient here today with c/o a headache that started this morning at about 12 am. Patient works 3rd shift and has not been getting enough rest due to trying to prepare for her kids going back to school.

## 2023-11-18 NOTE — Discharge Instructions (Addendum)
 Take ibuprofen  800 mg--1 tab every 8 hours as needed for pain.  Make sure you are drinking plenty of oral fluids.

## 2023-12-13 DIAGNOSIS — Z419 Encounter for procedure for purposes other than remedying health state, unspecified: Secondary | ICD-10-CM | POA: Diagnosis not present
